# Patient Record
Sex: Female | Born: 1955 | Race: White | Hispanic: No | Smoking: Former smoker
Health system: Southern US, Community
[De-identification: ages and names within clinical notes are randomized; demographics above are authoritative.]

## PROBLEM LIST (undated history)

## (undated) DIAGNOSIS — I10 Essential (primary) hypertension: Secondary | ICD-10-CM

## (undated) DIAGNOSIS — E785 Hyperlipidemia, unspecified: Secondary | ICD-10-CM

## (undated) HISTORY — PX: ABDOMINAL HYSTERECTOMY: SHX81

---

## 2001-01-31 ENCOUNTER — Encounter: Admission: RE | Admit: 2001-01-31 | Discharge: 2001-01-31 | Payer: Self-pay | Admitting: Family Medicine

## 2001-01-31 ENCOUNTER — Encounter: Payer: Self-pay | Admitting: Family Medicine

## 2004-09-20 ENCOUNTER — Other Ambulatory Visit: Admission: RE | Admit: 2004-09-20 | Discharge: 2004-09-20 | Payer: Self-pay | Admitting: Family Medicine

## 2008-08-10 ENCOUNTER — Other Ambulatory Visit: Admission: RE | Admit: 2008-08-10 | Discharge: 2008-08-10 | Payer: Self-pay | Admitting: Family Medicine

## 2013-09-02 ENCOUNTER — Other Ambulatory Visit: Payer: Self-pay | Admitting: Family Medicine

## 2013-09-02 ENCOUNTER — Other Ambulatory Visit (HOSPITAL_COMMUNITY)
Admission: RE | Admit: 2013-09-02 | Discharge: 2013-09-02 | Disposition: A | Discharge status: Home / Self Care | Payer: BC Managed Care – PPO | Source: Ambulatory Visit | Attending: Family Medicine | Admitting: Family Medicine

## 2013-09-02 DIAGNOSIS — Z0000 Encounter for general adult medical examination without abnormal findings: Secondary | ICD-10-CM | POA: Insufficient documentation

## 2013-09-03 LAB — CYTOLOGY - PAP

## 2013-09-04 ENCOUNTER — Other Ambulatory Visit: Payer: Self-pay | Admitting: Family Medicine

## 2013-09-04 DIAGNOSIS — N852 Hypertrophy of uterus: Secondary | ICD-10-CM

## 2013-09-09 ENCOUNTER — Ambulatory Visit
Admission: RE | Admit: 2013-09-09 | Discharge: 2013-09-09 | Disposition: A | Discharge status: Home / Self Care | Payer: BC Managed Care – PPO | Source: Ambulatory Visit | Attending: Family Medicine | Admitting: Family Medicine

## 2013-09-09 ENCOUNTER — Ambulatory Visit
Admission: RE | Admit: 2013-09-09 | Discharge: 2013-09-09 | Disposition: A | Discharge status: Home / Self Care | Payer: Self-pay | Source: Ambulatory Visit | Attending: Family Medicine | Admitting: Family Medicine

## 2013-09-09 DIAGNOSIS — N852 Hypertrophy of uterus: Secondary | ICD-10-CM

## 2013-09-10 ENCOUNTER — Other Ambulatory Visit: Payer: Self-pay | Admitting: Obstetrics and Gynecology

## 2013-09-10 DIAGNOSIS — N838 Other noninflammatory disorders of ovary, fallopian tube and broad ligament: Secondary | ICD-10-CM

## 2013-09-11 ENCOUNTER — Other Ambulatory Visit: Payer: Self-pay

## 2013-09-11 DIAGNOSIS — Z1231 Encounter for screening mammogram for malignant neoplasm of breast: Secondary | ICD-10-CM

## 2013-09-14 ENCOUNTER — Ambulatory Visit
Admission: RE | Admit: 2013-09-14 | Discharge: 2013-09-14 | Disposition: A | Discharge status: Home / Self Care | Payer: BC Managed Care – PPO | Source: Ambulatory Visit | Attending: Obstetrics and Gynecology | Admitting: Obstetrics and Gynecology

## 2013-09-14 ENCOUNTER — Ambulatory Visit
Admission: RE | Admit: 2013-09-14 | Discharge: 2013-09-14 | Disposition: A | Discharge status: Home / Self Care | Payer: BC Managed Care – PPO | Source: Ambulatory Visit

## 2013-09-14 DIAGNOSIS — Z1231 Encounter for screening mammogram for malignant neoplasm of breast: Secondary | ICD-10-CM

## 2013-09-14 DIAGNOSIS — N838 Other noninflammatory disorders of ovary, fallopian tube and broad ligament: Secondary | ICD-10-CM

## 2013-09-14 MED ORDER — IOHEXOL 300 MG/ML  SOLN
100.0000 mL | Freq: Once | INTRAMUSCULAR | Status: AC | PRN
  Administered 2013-09-14: 100 mL via INTRAVENOUS

## 2015-04-12 IMAGING — US US PELVIS COMPLETE
1 series · 13 of 25 positions shown · non-contrast
Comparison: None

CLINICAL DATA: Uterine hypertrophy

EXAM:
TRANSABDOMINAL AND TRANSVAGINAL ULTRASOUND OF PELVIS
TECHNIQUE: Both transabdominal and transvaginal ultrasound examinations of the
pelvis were performed. Transabdominal technique was performed for
global imaging of the pelvis including uterus, ovaries, adnexal
regions, and pelvic cul-de-sac. It was necessary to proceed with
endovaginal exam following the transabdominal exam to visualize the
bilateral adnexa.

[Series 1: us pelvis complete · 0.25mm/px · 13 of 53 slices shown]
[im 1/53]
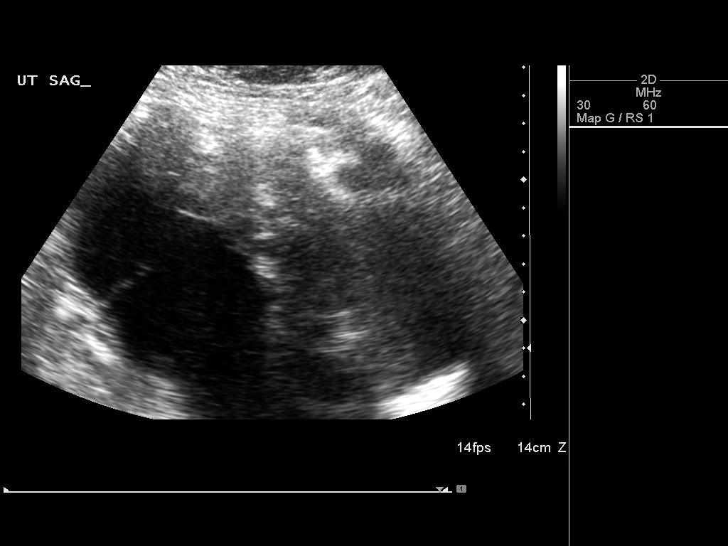
[im 5/53]
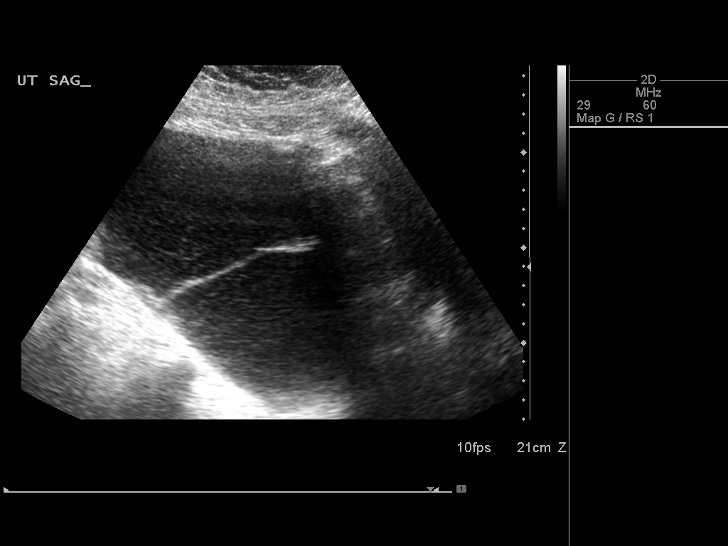
[im 9/53]
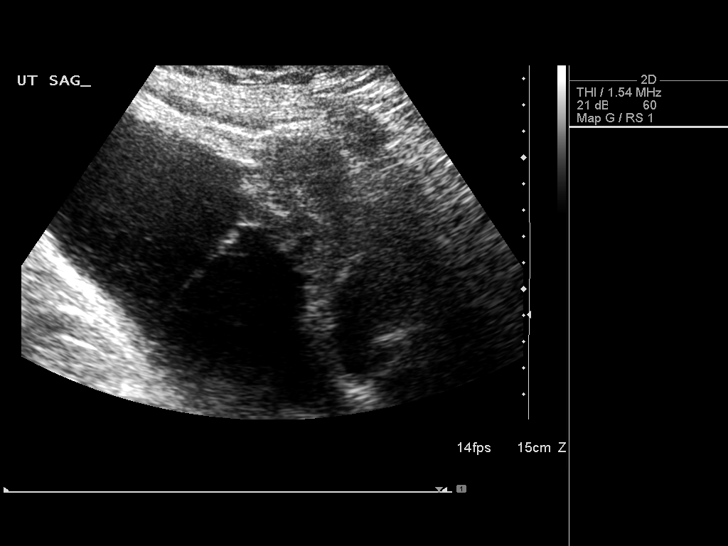
[im 14/53]
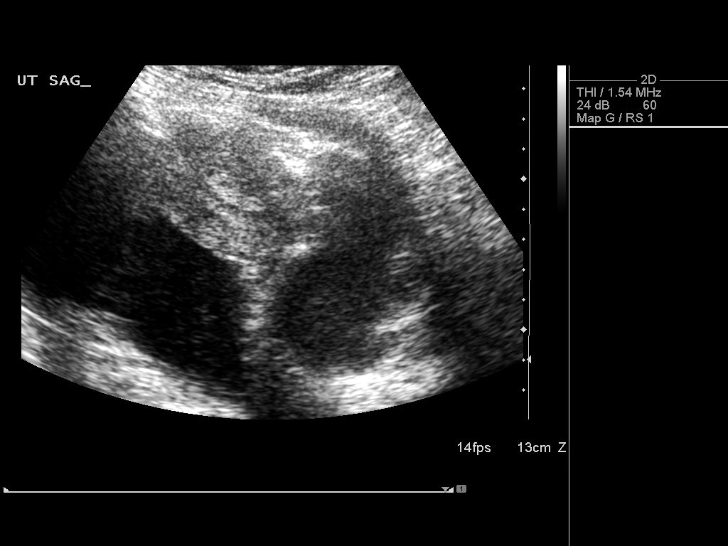
[im 18/53]
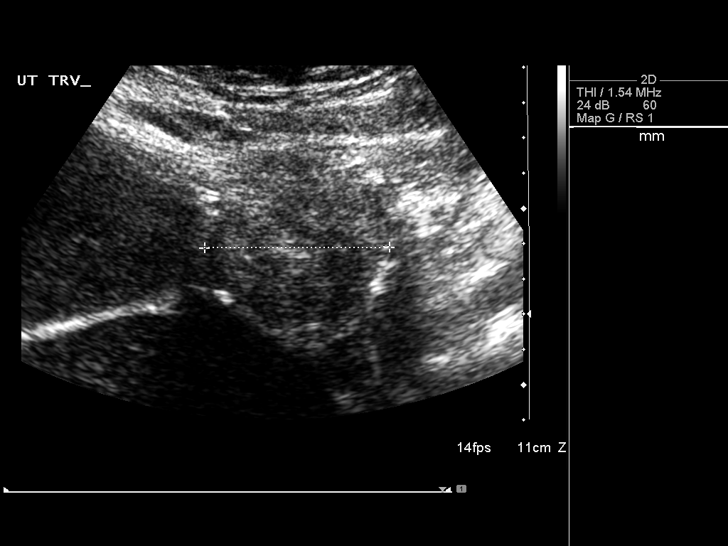
[im 22/53]
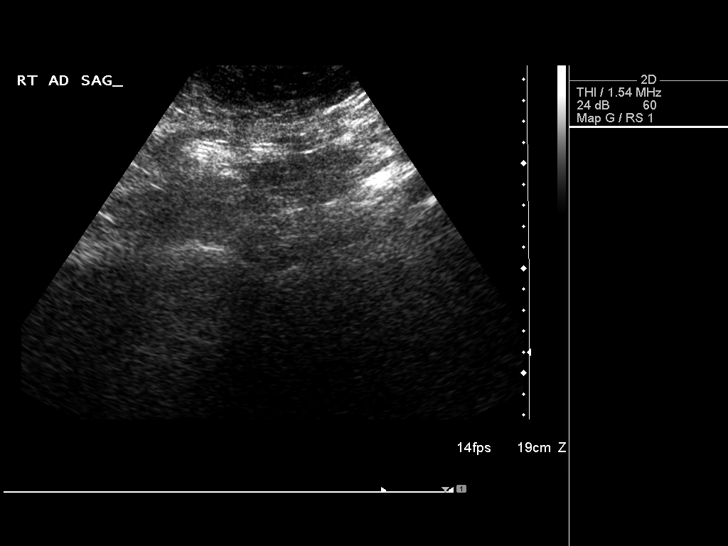
[im 27/53]
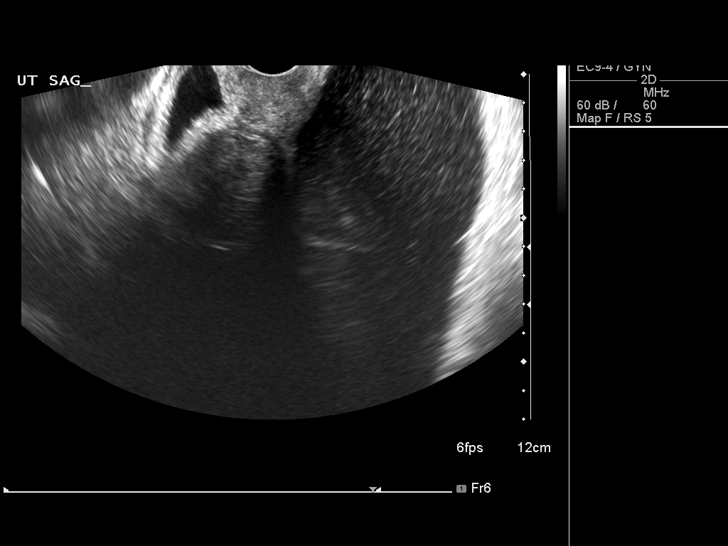
[im 31/53]
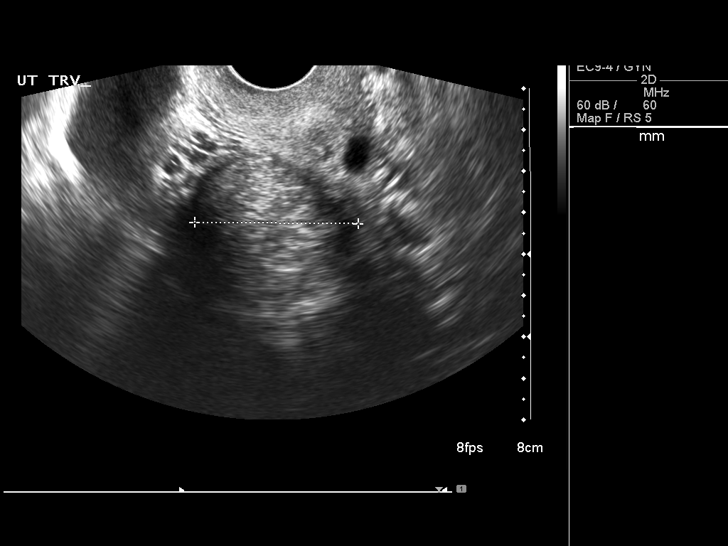
[im 35/53]
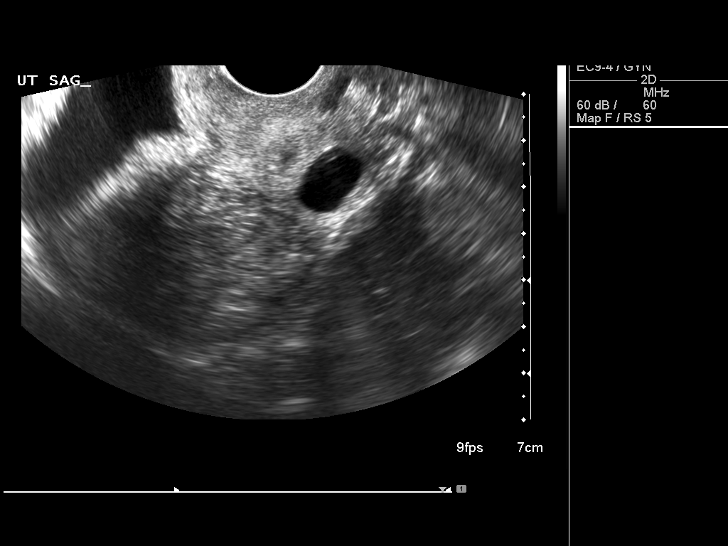
[im 40/53]
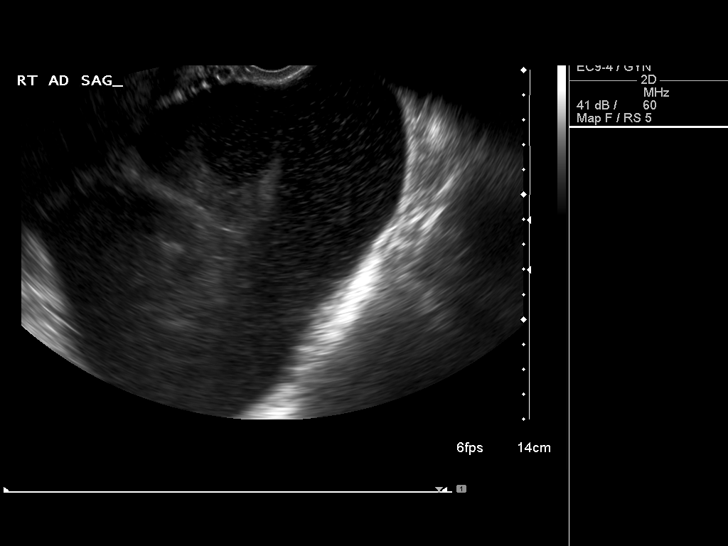
[im 44/53]
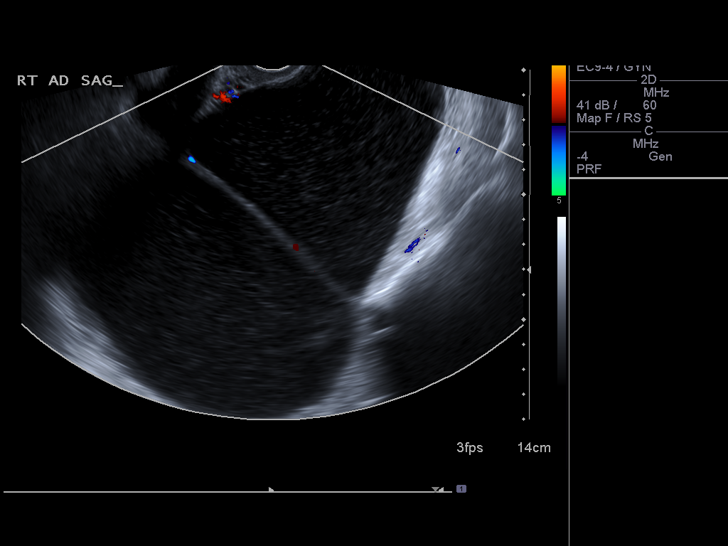
[im 48/53]
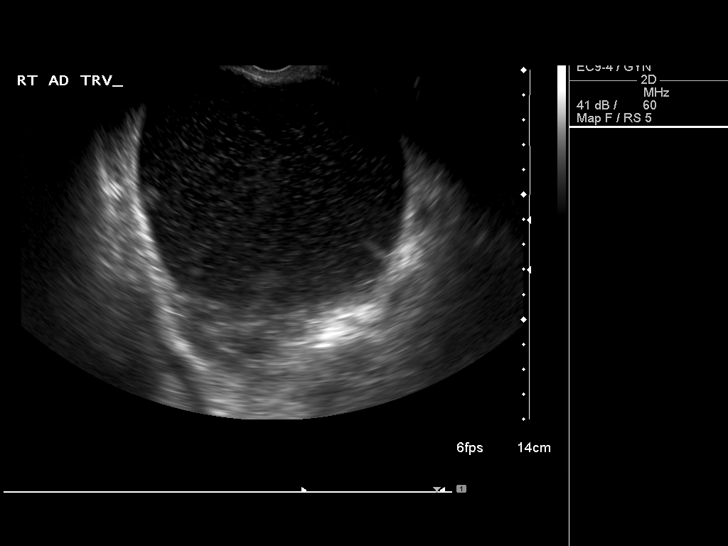
[im 53/53]
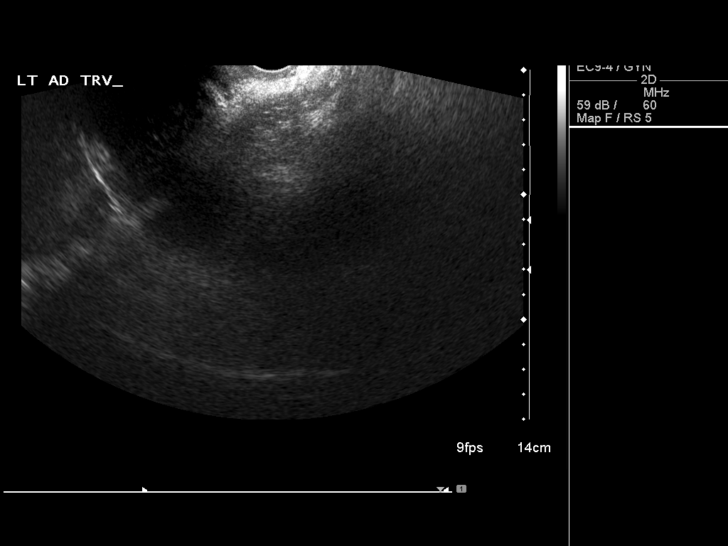

[13 of 25 positions shown; findings below may reference images not displayed]

FINDINGS: Uterus

Measurements: 10.6 x 4.3 x 5.2 cm. Dominant 3.9 x 4.4 x 4.0 cm
transmural posterior fundal fibroid.

Endometrium

Thickness: 5 mm.  No focal abnormality visualized.

Right ovary

Not discretely visualized. 16.4 x 12.4 x 12.7 cm complex/septated
right adnexal mass. Associated complex fluid/debris. Thickened
septation with possible color Doppler flow (image 14), worrisome for
malignancy.

Left ovary

Not discretely visualized.

Other findings

No free fluid.
IMPRESSION: 16.4 cm complex/septated right adnexal mass, with features worrisome
for malignancy, as above. OB-GYN/surgical consultation is suggested.
If further imaging characterization is desired, consider pelvic MRI
with/without contrast.

4.4 cm transmural posterior fundal fibroid.

## 2015-06-29 DIAGNOSIS — N183 Chronic kidney disease, stage 3 (moderate): Secondary | ICD-10-CM | POA: Diagnosis not present

## 2015-10-06 DIAGNOSIS — M9902 Segmental and somatic dysfunction of thoracic region: Secondary | ICD-10-CM | POA: Diagnosis not present

## 2015-10-06 DIAGNOSIS — M9904 Segmental and somatic dysfunction of sacral region: Secondary | ICD-10-CM | POA: Diagnosis not present

## 2015-10-06 DIAGNOSIS — M9903 Segmental and somatic dysfunction of lumbar region: Secondary | ICD-10-CM | POA: Diagnosis not present

## 2015-10-06 DIAGNOSIS — M9901 Segmental and somatic dysfunction of cervical region: Secondary | ICD-10-CM | POA: Diagnosis not present

## 2015-10-10 DIAGNOSIS — M9903 Segmental and somatic dysfunction of lumbar region: Secondary | ICD-10-CM | POA: Diagnosis not present

## 2015-10-10 DIAGNOSIS — M9901 Segmental and somatic dysfunction of cervical region: Secondary | ICD-10-CM | POA: Diagnosis not present

## 2015-10-10 DIAGNOSIS — M9904 Segmental and somatic dysfunction of sacral region: Secondary | ICD-10-CM | POA: Diagnosis not present

## 2015-10-10 DIAGNOSIS — M9902 Segmental and somatic dysfunction of thoracic region: Secondary | ICD-10-CM | POA: Diagnosis not present

## 2015-11-03 DIAGNOSIS — M9901 Segmental and somatic dysfunction of cervical region: Secondary | ICD-10-CM | POA: Diagnosis not present

## 2015-11-03 DIAGNOSIS — M9902 Segmental and somatic dysfunction of thoracic region: Secondary | ICD-10-CM | POA: Diagnosis not present

## 2015-11-03 DIAGNOSIS — M9903 Segmental and somatic dysfunction of lumbar region: Secondary | ICD-10-CM | POA: Diagnosis not present

## 2015-11-03 DIAGNOSIS — M9904 Segmental and somatic dysfunction of sacral region: Secondary | ICD-10-CM | POA: Diagnosis not present

## 2015-11-14 DIAGNOSIS — E782 Mixed hyperlipidemia: Secondary | ICD-10-CM | POA: Diagnosis not present

## 2015-11-14 DIAGNOSIS — Z0000 Encounter for general adult medical examination without abnormal findings: Secondary | ICD-10-CM | POA: Diagnosis not present

## 2015-11-14 DIAGNOSIS — I129 Hypertensive chronic kidney disease with stage 1 through stage 4 chronic kidney disease, or unspecified chronic kidney disease: Secondary | ICD-10-CM | POA: Diagnosis not present

## 2015-11-14 DIAGNOSIS — K219 Gastro-esophageal reflux disease without esophagitis: Secondary | ICD-10-CM | POA: Diagnosis not present

## 2015-11-14 DIAGNOSIS — N183 Chronic kidney disease, stage 3 (moderate): Secondary | ICD-10-CM | POA: Diagnosis not present

## 2016-01-19 DIAGNOSIS — E782 Mixed hyperlipidemia: Secondary | ICD-10-CM | POA: Diagnosis not present

## 2016-03-28 DIAGNOSIS — D1801 Hemangioma of skin and subcutaneous tissue: Secondary | ICD-10-CM | POA: Diagnosis not present

## 2016-03-28 DIAGNOSIS — L570 Actinic keratosis: Secondary | ICD-10-CM | POA: Diagnosis not present

## 2016-03-28 DIAGNOSIS — D2239 Melanocytic nevi of other parts of face: Secondary | ICD-10-CM | POA: Diagnosis not present

## 2016-03-28 DIAGNOSIS — L820 Inflamed seborrheic keratosis: Secondary | ICD-10-CM | POA: Diagnosis not present

## 2016-03-28 DIAGNOSIS — L814 Other melanin hyperpigmentation: Secondary | ICD-10-CM | POA: Diagnosis not present

## 2016-03-28 DIAGNOSIS — L821 Other seborrheic keratosis: Secondary | ICD-10-CM | POA: Diagnosis not present

## 2016-06-05 ENCOUNTER — Inpatient Hospital Stay (HOSPITAL_COMMUNITY)
Admission: EM | Admit: 2016-06-05 | Discharge: 2016-06-07 | DRG: 419 | Disposition: A | Discharge status: Home / Self Care | Payer: BLUE CROSS/BLUE SHIELD | Attending: General Surgery | Admitting: General Surgery

## 2016-06-05 ENCOUNTER — Emergency Department (HOSPITAL_COMMUNITY): Payer: BLUE CROSS/BLUE SHIELD

## 2016-06-05 ENCOUNTER — Encounter (HOSPITAL_COMMUNITY): Payer: Self-pay | Admitting: Emergency Medicine

## 2016-06-05 DIAGNOSIS — R109 Unspecified abdominal pain: Secondary | ICD-10-CM | POA: Diagnosis not present

## 2016-06-05 DIAGNOSIS — Z79899 Other long term (current) drug therapy: Secondary | ICD-10-CM | POA: Diagnosis not present

## 2016-06-05 DIAGNOSIS — K810 Acute cholecystitis: Secondary | ICD-10-CM | POA: Diagnosis not present

## 2016-06-05 DIAGNOSIS — K819 Cholecystitis, unspecified: Secondary | ICD-10-CM

## 2016-06-05 DIAGNOSIS — K8012 Calculus of gallbladder with acute and chronic cholecystitis without obstruction: Secondary | ICD-10-CM | POA: Diagnosis not present

## 2016-06-05 DIAGNOSIS — K8010 Calculus of gallbladder with chronic cholecystitis without obstruction: Secondary | ICD-10-CM | POA: Diagnosis not present

## 2016-06-05 DIAGNOSIS — K8000 Calculus of gallbladder with acute cholecystitis without obstruction: Principal | ICD-10-CM | POA: Diagnosis present

## 2016-06-05 DIAGNOSIS — Z87891 Personal history of nicotine dependence: Secondary | ICD-10-CM | POA: Diagnosis not present

## 2016-06-05 DIAGNOSIS — I10 Essential (primary) hypertension: Secondary | ICD-10-CM | POA: Diagnosis present

## 2016-06-05 DIAGNOSIS — R1013 Epigastric pain: Secondary | ICD-10-CM | POA: Diagnosis not present

## 2016-06-05 DIAGNOSIS — Z419 Encounter for procedure for purposes other than remedying health state, unspecified: Secondary | ICD-10-CM

## 2016-06-05 DIAGNOSIS — K8020 Calculus of gallbladder without cholecystitis without obstruction: Secondary | ICD-10-CM | POA: Diagnosis not present

## 2016-06-05 DIAGNOSIS — R1110 Vomiting, unspecified: Secondary | ICD-10-CM | POA: Diagnosis not present

## 2016-06-05 HISTORY — DX: Hyperlipidemia, unspecified: E78.5

## 2016-06-05 HISTORY — DX: Essential (primary) hypertension: I10

## 2016-06-05 LAB — COMPREHENSIVE METABOLIC PANEL
ALK PHOS: 59 U/L (ref 38–126)
ALT: 21 U/L (ref 14–54)
ANION GAP: 9 (ref 5–15)
AST: 27 U/L (ref 15–41)
Albumin: 3.8 g/dL (ref 3.5–5.0)
BILIRUBIN TOTAL: 0.6 mg/dL (ref 0.3–1.2)
BUN: 22 mg/dL — ABNORMAL HIGH (ref 6–20)
CALCIUM: 9.5 mg/dL (ref 8.9–10.3)
CO2: 27 mmol/L (ref 22–32)
Chloride: 102 mmol/L (ref 101–111)
Creatinine, Ser: 1.04 mg/dL — ABNORMAL HIGH (ref 0.44–1.00)
GFR calc non Af Amer: 56 mL/min — ABNORMAL LOW (ref >60)
Glucose, Bld: 126 mg/dL — ABNORMAL HIGH (ref 65–99)
Potassium: 3.8 mmol/L (ref 3.5–5.1)
SODIUM: 138 mmol/L (ref 135–145)
TOTAL PROTEIN: 7.4 g/dL (ref 6.5–8.1)

## 2016-06-05 LAB — CBC
HCT: 37.3 % (ref 36.0–46.0)
HEMOGLOBIN: 12.6 g/dL (ref 12.0–15.0)
MCH: 29.4 pg (ref 26.0–34.0)
MCHC: 33.8 g/dL (ref 30.0–36.0)
MCV: 87.1 fL (ref 78.0–100.0)
Platelets: 351 10*3/uL (ref 150–400)
RBC: 4.28 MIL/uL (ref 3.87–5.11)
RDW: 14 % (ref 11.5–15.5)
WBC: 17.4 10*3/uL — AB (ref 4.0–10.5)

## 2016-06-05 LAB — URINALYSIS, ROUTINE W REFLEX MICROSCOPIC
Bilirubin Urine: NEGATIVE
Glucose, UA: NEGATIVE mg/dL
Hgb urine dipstick: NEGATIVE
Ketones, ur: NEGATIVE mg/dL
Leukocytes, UA: NEGATIVE
NITRITE: NEGATIVE
Protein, ur: NEGATIVE mg/dL
Specific Gravity, Urine: >1.046 — ABNORMAL HIGH (ref 1.005–1.030)
pH: 6 (ref 5.0–8.0)

## 2016-06-05 LAB — LIPASE, BLOOD: Lipase: 29 U/L (ref 11–51)

## 2016-06-05 LAB — TROPONIN I

## 2016-06-05 MED ORDER — FENTANYL CITRATE (PF) 100 MCG/2ML IJ SOLN
100.0000 ug | Freq: Once | INTRAMUSCULAR | Status: AC
  Administered 2016-06-05: 100 ug via INTRAVENOUS
  Filled 2016-06-05: qty 2

## 2016-06-05 MED ORDER — SODIUM CHLORIDE 0.9 % IV BOLUS (SEPSIS)
1000.0000 mL | Freq: Once | INTRAVENOUS | Status: AC
  Administered 2016-06-05: 1000 mL via INTRAVENOUS

## 2016-06-05 MED ORDER — DEXTROSE 5 % IV SOLN
2.0000 g | Freq: Once | INTRAVENOUS | Status: AC
  Administered 2016-06-05: 2 g via INTRAVENOUS
  Filled 2016-06-05: qty 2

## 2016-06-05 MED ORDER — ONDANSETRON HCL 4 MG/2ML IJ SOLN
4.0000 mg | Freq: Four times a day (QID) | INTRAMUSCULAR | Status: DC | PRN
  Administered 2016-06-05: 4 mg via INTRAVENOUS
  Filled 2016-06-05: qty 2

## 2016-06-05 MED ORDER — MORPHINE SULFATE (PF) 4 MG/ML IV SOLN
2.0000 mg | INTRAVENOUS | Status: DC | PRN
  Administered 2016-06-05: 6 mg via INTRAVENOUS
  Administered 2016-06-06: 4 mg via INTRAVENOUS
  Administered 2016-06-06 (×2): 2 mg via INTRAVENOUS
  Filled 2016-06-05: qty 2
  Filled 2016-06-05 (×3): qty 1

## 2016-06-05 MED ORDER — FENTANYL CITRATE (PF) 100 MCG/2ML IJ SOLN
50.0000 ug | Freq: Once | INTRAMUSCULAR | Status: AC
Start: 2016-06-05 — End: 2016-06-05
  Administered 2016-06-05: 50 ug via INTRAVENOUS
  Filled 2016-06-05: qty 2

## 2016-06-05 MED ORDER — ONDANSETRON 4 MG PO TBDP: 4.0000 mg | ORAL_TABLET | Freq: Four times a day (QID) | ORAL | Status: DC | PRN

## 2016-06-05 MED ORDER — ONDANSETRON 4 MG PO TBDP
4.0000 mg | ORAL_TABLET | Freq: Once | ORAL | Status: DC | PRN
Start: 2016-06-05 — End: 2016-06-06

## 2016-06-05 MED ORDER — ONDANSETRON HCL 4 MG/2ML IJ SOLN
4.0000 mg | Freq: Once | INTRAMUSCULAR | Status: AC
  Administered 2016-06-05: 4 mg via INTRAVENOUS
  Filled 2016-06-05: qty 2

## 2016-06-05 MED ORDER — IOPAMIDOL (ISOVUE-300) INJECTION 61%
INTRAVENOUS | Status: AC
  Filled 2016-06-05: qty 100

## 2016-06-05 MED ORDER — IOPAMIDOL (ISOVUE-300) INJECTION 61%
100.0000 mL | Freq: Once | INTRAVENOUS | Status: AC | PRN
  Administered 2016-06-05: 100 mL via INTRAVENOUS

## 2016-06-05 NOTE — ED Notes (Signed)
Pt does not want to be bothered with vitals she states she "wants to sleep"

## 2016-06-05 NOTE — H&P (Signed)
Helen Hughes is an 61 y.o. female.    Chief Complaint: abdominal pain  HPI: Patient is a very pleasant 61 year old female who 2 days ago had the onset of epigastric abdominal pain. She has some chronic GERD and initially thought it might be related. However the pain quickly became more severe. She describes a pressure-like pain in her epigastrium that radiates around both rib cages to her back. This has been associated with nausea and vomiting. The pain has been constant since its onset. It is worse with deep breaths. She denies fever or chills. No jaundice. No urinary symptoms. Bowel movements have been normal. She doesn't have history of similar pain but does have again occasional reflux. She presented to the emergency department this evening.  Past Medical History:  Diagnosis Date  . Hyperlipidemia   . Hypertension     Past Surgical History:  Procedure Laterality Date  . ABDOMINAL HYSTERECTOMY      History reviewed. No pertinent family history. Social History:  reports that she quit smoking about 39 years ago. Her smoking use included Cigarettes. She has never used smokeless tobacco. She reports that she drinks alcohol. She reports that she has current or past drug history, including Marijuana and Cocaine.  Allergies: No Known Allergies  Current Facility-Administered Medications  Medication Dose Route Frequency Provider Last Rate Last Dose  . ondansetron (ZOFRAN-ODT) disintegrating tablet 4 mg  4 mg Oral Once PRN Noemi Chapel, MD       Current Outpatient Prescriptions  Medication Sig Dispense Refill  . b complex vitamins capsule Take 1 capsule by mouth daily.    . cholecalciferol (VITAMIN D) 1000 units tablet Take 1,000 Units by mouth daily.    Marland Kitchen losartan-hydrochlorothiazide (HYZAAR) 100-25 MG tablet Take 1 tablet by mouth every morning.  1  . magnesium 30 MG tablet Take 30 mg by mouth daily.    . Red Yeast Rice Extract (RED YEAST RICE PO) Take 1 capsule by mouth 2 (two) times  daily.       Results for orders placed or performed during the hospital encounter of 06/05/16 (from the past 48 hour(s))  Urinalysis, Routine w reflex microscopic     Status: Abnormal   Collection Time: 06/05/16  2:05 PM  Result Value Ref Range   Color, Urine YELLOW YELLOW   APPearance CLEAR CLEAR   Specific Gravity, Urine >1.046 (H) 1.005 - 1.030   pH 6.0 5.0 - 8.0   Glucose, UA NEGATIVE NEGATIVE mg/dL   Hgb urine dipstick NEGATIVE NEGATIVE   Bilirubin Urine NEGATIVE NEGATIVE   Ketones, ur NEGATIVE NEGATIVE mg/dL   Protein, ur NEGATIVE NEGATIVE mg/dL   Nitrite NEGATIVE NEGATIVE   Leukocytes, UA NEGATIVE NEGATIVE  Lipase, blood     Status: None   Collection Time: 06/05/16  2:17 PM  Result Value Ref Range   Lipase 29 11 - 51 U/L  Comprehensive metabolic panel     Status: Abnormal   Collection Time: 06/05/16  2:17 PM  Result Value Ref Range   Sodium 138 135 - 145 mmol/L   Potassium 3.8 3.5 - 5.1 mmol/L   Chloride 102 101 - 111 mmol/L   CO2 27 22 - 32 mmol/L   Glucose, Bld 126 (H) 65 - 99 mg/dL   BUN 22 (H) 6 - 20 mg/dL   Creatinine, Ser 1.04 (H) 0.44 - 1.00 mg/dL   Calcium 9.5 8.9 - 10.3 mg/dL   Total Protein 7.4 6.5 - 8.1 g/dL   Albumin 3.8 3.5 -  5.0 g/dL   AST 27 15 - 41 U/L   ALT 21 14 - 54 U/L   Alkaline Phosphatase 59 38 - 126 U/L   Total Bilirubin 0.6 0.3 - 1.2 mg/dL   GFR calc non Af Amer 56 (L) >60 mL/min   GFR calc Af Amer >60 >60 mL/min    Comment: (NOTE) The eGFR has been calculated using the CKD EPI equation. This calculation has not been validated in all clinical situations. eGFR's persistently <60 mL/min signify possible Chronic Kidney Disease.    Anion gap 9 5 - 15  CBC     Status: Abnormal   Collection Time: 06/05/16  2:17 PM  Result Value Ref Range   WBC 17.4 (H) 4.0 - 10.5 K/uL   RBC 4.28 3.87 - 5.11 MIL/uL   Hemoglobin 12.6 12.0 - 15.0 g/dL   HCT 37.3 36.0 - 46.0 %   MCV 87.1 78.0 - 100.0 fL   MCH 29.4 26.0 - 34.0 pg   MCHC 33.8 30.0 - 36.0  g/dL   RDW 14.0 11.5 - 15.5 %   Platelets 351 150 - 400 K/uL  Troponin I     Status: None   Collection Time: 06/05/16  4:30 PM  Result Value Ref Range   Troponin I <0.03 <0.03 ng/mL   Ct Abdomen Pelvis W Contrast  Result Date: 06/05/2016 CLINICAL DATA:  61 year old female with abdominal pain and vomiting since last night. Initial encounter. EXAM: CT ABDOMEN AND PELVIS WITH CONTRAST TECHNIQUE: Multidetector CT imaging of the abdomen and pelvis was performed using the standard protocol following bolus administration of intravenous contrast. CONTRAST:  161m ISOVUE-300 IOPAMIDOL (ISOVUE-300) INJECTION 61% COMPARISON:  None. FINDINGS: Lower chest: Minimal atelectasis/ scarring lung bases. Heart size within normal limits. Hepatobiliary: Liver enlarged spanning over 20 cm with fatty infiltration without focal worrisome mass. Abnormal appearance of the gallbladder suggestive of cholecystitis. Pericholecystic fluid versus gallbladder wall thickening. Gallstones may be present but are not calcified. Pancreas: No pancreatic mass, inflammation or pancreatic duct dilation. Spleen: No splenic lesion or enlargement. Adrenals/Urinary Tract: No adrenal lesion. No renal or ureteral obstructing stone or evidence hydronephrosis bilateral renal low-density structures, larger ones can be confirmed as cysts whereas others are too small to adequately characterize. There is a 6.8 mm lesion within the superior medial aspect of the right kidney which has slightly thickened borders. Dedicated contrast enhanced renal MR recommended for further delineation. Noncontrast filled views of the urinary bladder unremarkable. Stomach/Bowel: Small hiatal hernia. Partially decompressed gastric fundus without gross abnormality. Portions of the colon are underdistended limiting evaluation however, no extraluminal bowel inflammatory process detected. The small amount of free fluid in the pelvis is probably related to the cholecystitis. Appendix  not clearly delineated. Vascular/Lymphatic: Atherosclerotic changes abdominal aorta without aneurysmal dilation or major vessel occlusion. No adenopathy. Reproductive: Post hysterectomy. Previously noted pelvic cystic lesion may have been surgically resected. Other: Trace amount of free fluid in the pelvis felt to be related to cholecystitis. Musculoskeletal: Degenerative changes thoracic and lumbar spine including gas containing left paracentral L3-4 cephalad extending disc protrusion. IMPRESSION: Abnormal appearance of the gallbladder suggestive of cholecystitis. Pericholecystic fluid versus gallbladder wall thickening. Gallstones may be present but are not calcified. Trace amount of free fluid in the pelvis felt to be related to cholecystitis. Liver enlarged spanning over 20 cm with fatty infiltration. 6.8 mm lesion within the superior medial aspect of the right kidney which has slightly thickened borders and slightly hyperdense. Dedicated contrast enhanced renal MR recommended for  further delineation. Post hysterectomy. Previously noted pelvic cystic lesion may have been surgically resected. No primary bowel inflammatory process.  Appendix not visualized. Degenerative changes thoracic and lumbar spine including gas containing left paracentral L3-4 cephalad extending disc protrusion. These results were called by telephone at the time of interpretation on 06/05/2016 at 4:56 pm to Will Dansie, ER PA, who verbally acknowledged these results. Electronically Signed   By: Genia Del M.D.   On: 06/05/2016 17:20    Review of Systems  Constitutional: Negative for chills, fever and weight loss.  Respiratory: Negative.   Cardiovascular: Negative.   Gastrointestinal: Positive for abdominal pain, heartburn, nausea and vomiting. Negative for blood in stool, constipation and diarrhea.  Genitourinary: Negative.     Blood pressure 152/76, pulse 102, temperature 98.1 F (36.7 C), temperature source Oral, resp. rate  14, SpO2 100 %. Physical Exam  General: Alert, mildly overweight Caucasian female, in no distress Skin: Warm and dry without rash or infection. HEENT: No palpable masses or thyromegaly. Sclera nonicteric. Pupils equal round and reactive. Lymph nodes: No cervical, supraclavicular,  nodes palpable. Lungs: Breath sounds clear and equal without increased work of breathing Cardiovascular: Regular rate and rhythm without murmur. No JVD or edema. Abdomen: Nondistended. Moderate tenderness in the epigastrium with slight guarding. No masses palpable. No organomegaly. No palpable hernias. Extremities: No edema or joint swelling or deformity. No chronic venous stasis changes. Neurologic: Alert and fully oriented. No gross motor deficits.   Assessment/Plan Acute epigastric abdominal pain of 48 hours duration. CT scan suggestive of acute cholecystitis although no stones seen. She likely has acute cholecystitis. Peptic ulcer disease or severe reflux seems much less likely. Patient will be admitted and started on IV antibiotics, pain and nausea medication. Obtain gallbladder ultrasound. Likely will require laparoscopic cholecystectomy. Discussed with the patient and all her questions answered.  Edward Jolly, MD 06/05/2016, 7:29 PM

## 2016-06-05 NOTE — ED Notes (Signed)
Pt made aware of need for urine specimen 

## 2016-06-05 NOTE — ED Provider Notes (Signed)
[facility] DEPT Provider Note   CSN: [id number] Arrival date & time: 06/05/16  1341     History   Chief Complaint Chief Complaint  Patient presents with  . Abdominal Pain    HPI Helen Hughes is a 61 y.o. female.   Abdominal Pain   Associated symptoms include nausea and vomiting. Pertinent negatives include fever, diarrhea, dysuria, frequency and headaches.    The patient is a 61 year old female, she does have a prior history of a hysterectomy performed a couple of years ago and states that she had her appendix out at the same time. She denies any prior history of gallbladder disease, pancreatitis or peptic ulcer disease but states that last night she started to have some epigastric pain, nausea and vomiting. This has been persistent throughout the evening and states that she has vomited more than 20 times in the last 12 hours. She denies any changes in her bowel habits including no blood in the stools, no diarrhea and has had normal stools as well as normal gas today. She states that her abdomen feels distended and bloated. She has been unable to eat or drink because of that. When she called her doctor's office this morning she was told that this could potentially be related to her heart, her pancreas, her gallbladder, or something else and was referred to the emergency department. The patient has never had any of these problems in the past. At this time her symptoms are moderate to severe. She states that the pain does radiate to her back.  Past Medical History:  Diagnosis Date  . Hyperlipidemia   . Hypertension     There are no active problems to display for this patient.   Past Surgical History:  Procedure Laterality Date  . ABDOMINAL HYSTERECTOMY      OB History    No data available       Home Medications    Prior to Admission medications   Medication Sig Start Date End Date Taking? Authorizing Provider  b complex vitamins capsule Take 1 capsule by mouth  daily.   Yes Historical Provider, MD  cholecalciferol (VITAMIN D) 1000 units tablet Take 1,000 Units by mouth daily.   Yes Historical Provider, MD  losartan-hydrochlorothiazide (HYZAAR) 100-25 MG tablet Take 1 tablet by mouth every morning. 03/07/16  Yes Historical Provider, MD  magnesium 30 MG tablet Take 30 mg by mouth daily.   Yes Historical Provider, MD  Red Yeast Rice Extract (RED YEAST RICE PO) Take 1 capsule by mouth 2 (two) times daily.   Yes Historical Provider, MD    Family History History reviewed. No pertinent family history.  Social History Social History  Substance Use Topics  . Smoking status: Former Smoker    Types: Cigarettes    Quit date: 06/05/1977  . Smokeless tobacco: Never Used  . Alcohol use Yes     Comment: occasional wine     Allergies   Patient has no known allergies.   Review of Systems Review of Systems  Constitutional: Negative for chills and fever.  HENT: Negative for sore throat.   Eyes: Negative for visual disturbance.  Respiratory: Negative for cough and shortness of breath.   Cardiovascular: Negative for chest pain.  Gastrointestinal: Positive for abdominal pain, nausea and vomiting. Negative for diarrhea.  Genitourinary: Negative for dysuria and frequency.  Musculoskeletal: Negative for back pain and neck pain.  Skin: Negative for rash.  Neurological: Negative for weakness, numbness and headaches.  Hematological: Negative for adenopathy.  Psychiatric/Behavioral: Negative for behavioral problems.     Physical Exam Updated Vital Signs BP 152/76 (BP Location: Left Arm)   Pulse 102   Temp 98.1 F (36.7 C) (Oral)   Resp 14   SpO2 100%   Physical Exam  Constitutional: She appears well-developed and well-nourished. No distress.  HENT:  Head: Normocephalic and atraumatic.  Mouth/Throat: Oropharynx is clear and moist. No oropharyngeal exudate.  Eyes: Conjunctivae and EOM are normal. Pupils are equal, round, and reactive to light. Right  eye exhibits no discharge. Left eye exhibits no discharge. No scleral icterus.  No jaundice  Neck: Normal range of motion. Neck supple. No JVD present. No thyromegaly present.  Cardiovascular: Regular rhythm, normal heart sounds and intact distal pulses.  Exam reveals no gallop and no friction rub.   No murmur heard. Mild tachycardia but normal pulses at the radial arteries bilaterally  Pulmonary/Chest: Effort normal and breath sounds normal. No respiratory distress. She has no wheezes. She has no rales. She exhibits tenderness.  Mild tenderness over the mid chest wall on the lower sternum  Abdominal: Soft. Bowel sounds are normal. She exhibits no distension and no mass. There is tenderness.  There is no tympanitic sounds to percussion, bowel sounds are normal, tenderness in the right upper, left upper and epigastric abdomen as well as the left lower quadrant. Mild guarding in the upper abdomen.  Musculoskeletal: Normal range of motion. She exhibits no edema or tenderness.  Lymphadenopathy:    She has no cervical adenopathy.  Neurological: She is alert. Coordination normal.  Skin: Skin is warm and dry. No rash noted. No erythema.  Psychiatric: She has a normal mood and affect. Her behavior is normal.  Nursing note and vitals reviewed.    ED Treatments / Results  Labs (all labs ordered are listed, but only abnormal results are displayed) Labs Reviewed  COMPREHENSIVE METABOLIC PANEL - Abnormal; Notable for the following:       Result Value   Glucose, Bld 126 (*)    BUN 22 (*)    Creatinine, Ser 1.04 (*)    GFR calc non Af Amer 56 (*)    All other components within normal limits  CBC - Abnormal; Notable for the following:    WBC 17.4 (*)    All other components within normal limits  LIPASE, BLOOD  TROPONIN I  URINALYSIS, ROUTINE W REFLEX MICROSCOPIC    Radiology Ct Abdomen Pelvis W Contrast  Result Date: 06/05/2016 CLINICAL DATA:  61 year old female with abdominal pain and  vomiting since last night. Initial encounter. EXAM: CT ABDOMEN AND PELVIS WITH CONTRAST TECHNIQUE: Multidetector CT imaging of the abdomen and pelvis was performed using the standard protocol following bolus administration of intravenous contrast. CONTRAST:  161mL ISOVUE-300 IOPAMIDOL (ISOVUE-300) INJECTION 61% COMPARISON:  None. FINDINGS: Lower chest: Minimal atelectasis/ scarring lung bases. Heart size within normal limits. Hepatobiliary: Liver enlarged spanning over 20 cm with fatty infiltration without focal worrisome mass. Abnormal appearance of the gallbladder suggestive of cholecystitis. Pericholecystic fluid versus gallbladder wall thickening. Gallstones may be present but are not calcified. Pancreas: No pancreatic mass, inflammation or pancreatic duct dilation. Spleen: No splenic lesion or enlargement. Adrenals/Urinary Tract: No adrenal lesion. No renal or ureteral obstructing stone or evidence hydronephrosis bilateral renal low-density structures, larger ones can be confirmed as cysts whereas others are too small to adequately characterize. There is a 6.8 mm lesion within the superior medial aspect of the right kidney which has slightly thickened borders. Dedicated contrast enhanced renal MR  recommended for further delineation. Noncontrast filled views of the urinary bladder unremarkable. Stomach/Bowel: Small hiatal hernia. Partially decompressed gastric fundus without gross abnormality. Portions of the colon are underdistended limiting evaluation however, no extraluminal bowel inflammatory process detected. The small amount of free fluid in the pelvis is probably related to the cholecystitis. Appendix not clearly delineated. Vascular/Lymphatic: Atherosclerotic changes abdominal aorta without aneurysmal dilation or major vessel occlusion. No adenopathy. Reproductive: Post hysterectomy. Previously noted pelvic cystic lesion may have been surgically resected. Other: Trace amount of free fluid in the pelvis  felt to be related to cholecystitis. Musculoskeletal: Degenerative changes thoracic and lumbar spine including gas containing left paracentral L3-4 cephalad extending disc protrusion. IMPRESSION: Abnormal appearance of the gallbladder suggestive of cholecystitis. Pericholecystic fluid versus gallbladder wall thickening. Gallstones may be present but are not calcified. Trace amount of free fluid in the pelvis felt to be related to cholecystitis. Liver enlarged spanning over 20 cm with fatty infiltration. 6.8 mm lesion within the superior medial aspect of the right kidney which has slightly thickened borders and slightly hyperdense. Dedicated contrast enhanced renal MR recommended for further delineation. Post hysterectomy. Previously noted pelvic cystic lesion may have been surgically resected. No primary bowel inflammatory process.  Appendix not visualized. Degenerative changes thoracic and lumbar spine including gas containing left paracentral L3-4 cephalad extending disc protrusion. These results were called by telephone at the time of interpretation on 06/05/2016 at 4:56 pm to Will Dansie, ER PA, who verbally acknowledged these results. Electronically Signed   By: Genia Del M.D.   On: 06/05/2016 17:20    Procedures Procedures (including critical care time)  Medications Ordered in ED Medications  ondansetron (ZOFRAN-ODT) disintegrating tablet 4 mg (not administered)  iopamidol (ISOVUE-300) 61 % injection (not administered)  cefTRIAXone (ROCEPHIN) 2 g in dextrose 5 % 50 mL IVPB (not administered)  sodium chloride 0.9 % bolus 1,000 mL (1,000 mLs Intravenous New Bag/Given 06/05/16 1635)  ondansetron (ZOFRAN) injection 4 mg (4 mg Intravenous Given 06/05/16 1635)  fentaNYL (SUBLIMAZE) injection 100 mcg (100 mcg Intravenous Given 06/05/16 1635)  iopamidol (ISOVUE-300) 61 % injection 100 mL (100 mLs Intravenous Contrast Given 06/05/16 1644)     Initial Impression / Assessment and Plan / ED Course  I  have reviewed the triage vital signs and the nursing notes.  Pertinent labs & imaging results that were available during my care of the patient were reviewed by me and considered in medical decision making (see chart for details).     The patient has concerns for multiple abdominal etiologies as well as her heart, this does not appear to be cardiac in nature. She has very reproducible pain with persistent nausea and vomiting, I will obtain labs including CBC, CMP, lipase as well as a troponin for the patient's benefit. We'll also obtain a CT scan to further evaluate the source of these symptoms as the patient does not have a focal right upper quadrant tenderness. The patient is in agreement with the plan. She has been given IV fluids, Zofran and fentanyl.  Labs reviewed and show leukocytosis of over 17,000, normal lipase, normal comprehensive metabolic panel as well as renal function, liver function.  D/w Dr. Excell Seltzer - will see when done in the OR.  Final Clinical Impressions(s) / ED Diagnoses   Final diagnoses:  Cholecystitis    New Prescriptions New Prescriptions   No medications on file     Noemi Chapel, MD 06/05/16 1816

## 2016-06-05 NOTE — ED Triage Notes (Signed)
Per pt, states abdominal pain and vomiting since last night-PCP told her to come to ED for eval

## 2016-06-06 ENCOUNTER — Inpatient Hospital Stay (HOSPITAL_COMMUNITY): Payer: BLUE CROSS/BLUE SHIELD | Admitting: Registered Nurse

## 2016-06-06 ENCOUNTER — Encounter (HOSPITAL_COMMUNITY): Payer: Self-pay

## 2016-06-06 ENCOUNTER — Encounter (HOSPITAL_COMMUNITY): Admission: EM | Disposition: A | Discharge status: Home / Self Care | Payer: Self-pay | Source: Home / Self Care

## 2016-06-06 ENCOUNTER — Inpatient Hospital Stay (HOSPITAL_COMMUNITY): Payer: BLUE CROSS/BLUE SHIELD

## 2016-06-06 DIAGNOSIS — K8012 Calculus of gallbladder with acute and chronic cholecystitis without obstruction: Secondary | ICD-10-CM | POA: Diagnosis not present

## 2016-06-06 DIAGNOSIS — Z79899 Other long term (current) drug therapy: Secondary | ICD-10-CM | POA: Diagnosis not present

## 2016-06-06 DIAGNOSIS — K8010 Calculus of gallbladder with chronic cholecystitis without obstruction: Secondary | ICD-10-CM | POA: Diagnosis not present

## 2016-06-06 DIAGNOSIS — K8000 Calculus of gallbladder with acute cholecystitis without obstruction: Secondary | ICD-10-CM | POA: Diagnosis present

## 2016-06-06 DIAGNOSIS — Z87891 Personal history of nicotine dependence: Secondary | ICD-10-CM | POA: Diagnosis not present

## 2016-06-06 DIAGNOSIS — I10 Essential (primary) hypertension: Secondary | ICD-10-CM | POA: Diagnosis present

## 2016-06-06 DIAGNOSIS — K810 Acute cholecystitis: Secondary | ICD-10-CM | POA: Diagnosis present

## 2016-06-06 DIAGNOSIS — K819 Cholecystitis, unspecified: Secondary | ICD-10-CM | POA: Diagnosis not present

## 2016-06-06 HISTORY — PX: CHOLECYSTECTOMY: SHX55

## 2016-06-06 LAB — BASIC METABOLIC PANEL
Anion gap: 7 (ref 5–15)
BUN: 19 mg/dL (ref 6–20)
CHLORIDE: 106 mmol/L (ref 101–111)
CO2: 26 mmol/L (ref 22–32)
CREATININE: 1.03 mg/dL — AB (ref 0.44–1.00)
Calcium: 8.3 mg/dL — ABNORMAL LOW (ref 8.9–10.3)
GFR calc non Af Amer: 57 mL/min — ABNORMAL LOW (ref >60)
Glucose, Bld: 110 mg/dL — ABNORMAL HIGH (ref 65–99)
Potassium: 3.7 mmol/L (ref 3.5–5.1)
Sodium: 139 mmol/L (ref 135–145)

## 2016-06-06 LAB — CBC
HEMATOCRIT: 30.3 % — AB (ref 36.0–46.0)
HEMOGLOBIN: 10.1 g/dL — AB (ref 12.0–15.0)
MCH: 29.1 pg (ref 26.0–34.0)
MCHC: 33.3 g/dL (ref 30.0–36.0)
MCV: 87.3 fL (ref 78.0–100.0)
Platelets: 286 10*3/uL (ref 150–400)
RBC: 3.47 MIL/uL — ABNORMAL LOW (ref 3.87–5.11)
RDW: 14.4 % (ref 11.5–15.5)
WBC: 13.7 10*3/uL — ABNORMAL HIGH (ref 4.0–10.5)

## 2016-06-06 LAB — SURGICAL PCR SCREEN
MRSA, PCR: NEGATIVE
STAPHYLOCOCCUS AUREUS: NEGATIVE

## 2016-06-06 LAB — HIV ANTIBODY (ROUTINE TESTING W REFLEX): HIV Screen 4th Generation wRfx: NONREACTIVE

## 2016-06-06 SURGERY — LAPAROSCOPIC CHOLECYSTECTOMY WITH INTRAOPERATIVE CHOLANGIOGRAM
Anesthesia: General | Site: Abdomen

## 2016-06-06 MED ORDER — 0.9 % SODIUM CHLORIDE (POUR BTL) OPTIME
TOPICAL | Status: DC | PRN
  Administered 2016-06-06: 1000 mL

## 2016-06-06 MED ORDER — ONDANSETRON HCL 4 MG/2ML IJ SOLN: 4.0000 mg | Freq: Four times a day (QID) | INTRAMUSCULAR | Status: DC | PRN

## 2016-06-06 MED ORDER — SUCCINYLCHOLINE CHLORIDE 200 MG/10ML IV SOSY
PREFILLED_SYRINGE | INTRAVENOUS | Status: DC | PRN
  Administered 2016-06-06: 100 mg via INTRAVENOUS

## 2016-06-06 MED ORDER — HYDROMORPHONE HCL 1 MG/ML IJ SOLN: 1.0000 mg | INTRAMUSCULAR | Status: DC | PRN

## 2016-06-06 MED ORDER — LIDOCAINE 2% (20 MG/ML) 5 ML SYRINGE
INTRAMUSCULAR | Status: DC | PRN
  Administered 2016-06-06: 100 mg via INTRAVENOUS

## 2016-06-06 MED ORDER — PROMETHAZINE HCL 25 MG/ML IJ SOLN: 6.2500 mg | INTRAMUSCULAR | Status: DC | PRN

## 2016-06-06 MED ORDER — PROPOFOL 10 MG/ML IV BOLUS
INTRAVENOUS | Status: AC
  Filled 2016-06-06: qty 20

## 2016-06-06 MED ORDER — BUPIVACAINE-EPINEPHRINE 0.5% -1:200000 IJ SOLN
INTRAMUSCULAR | Status: DC | PRN
  Administered 2016-06-06: 20 mL

## 2016-06-06 MED ORDER — ONDANSETRON 4 MG PO TBDP: 4.0000 mg | ORAL_TABLET | Freq: Four times a day (QID) | ORAL | Status: DC | PRN

## 2016-06-06 MED ORDER — FENTANYL CITRATE (PF) 250 MCG/5ML IJ SOLN
INTRAMUSCULAR | Status: AC
  Filled 2016-06-06: qty 5

## 2016-06-06 MED ORDER — LACTATED RINGERS IV SOLN: INTRAVENOUS | Status: DC

## 2016-06-06 MED ORDER — CHLORHEXIDINE GLUCONATE CLOTH 2 % EX PADS
6.0000 | MEDICATED_PAD | Freq: Once | CUTANEOUS | Status: AC
  Administered 2016-06-06: 6 via TOPICAL

## 2016-06-06 MED ORDER — POTASSIUM CHLORIDE IN NACL 20-0.9 MEQ/L-% IV SOLN
INTRAVENOUS | Status: DC
  Administered 2016-06-06: 02:00:00 via INTRAVENOUS
  Filled 2016-06-06 (×2): qty 1000

## 2016-06-06 MED ORDER — SUCCINYLCHOLINE CHLORIDE 200 MG/10ML IV SOSY
PREFILLED_SYRINGE | INTRAVENOUS | Status: AC
  Filled 2016-06-06: qty 10

## 2016-06-06 MED ORDER — ONDANSETRON HCL 4 MG/2ML IJ SOLN
INTRAMUSCULAR | Status: DC | PRN
  Administered 2016-06-06: 4 mg via INTRAVENOUS

## 2016-06-06 MED ORDER — SUGAMMADEX SODIUM 200 MG/2ML IV SOLN
INTRAVENOUS | Status: DC | PRN
  Administered 2016-06-06: 180 mg via INTRAVENOUS

## 2016-06-06 MED ORDER — FENTANYL CITRATE (PF) 100 MCG/2ML IJ SOLN
INTRAMUSCULAR | Status: DC | PRN
  Administered 2016-06-06: 50 ug via INTRAVENOUS
  Administered 2016-06-06: 100 ug via INTRAVENOUS
  Administered 2016-06-06: 50 ug via INTRAVENOUS

## 2016-06-06 MED ORDER — PHENYLEPHRINE 40 MCG/ML (10ML) SYRINGE FOR IV PUSH (FOR BLOOD PRESSURE SUPPORT)
PREFILLED_SYRINGE | INTRAVENOUS | Status: DC | PRN
  Administered 2016-06-06 (×3): 80 ug via INTRAVENOUS

## 2016-06-06 MED ORDER — MIDAZOLAM HCL 5 MG/5ML IJ SOLN
INTRAMUSCULAR | Status: DC | PRN
  Administered 2016-06-06: 2 mg via INTRAVENOUS

## 2016-06-06 MED ORDER — IOPAMIDOL (ISOVUE-300) INJECTION 61%
INTRAVENOUS | Status: AC
  Filled 2016-06-06: qty 50

## 2016-06-06 MED ORDER — HYDROMORPHONE HCL 1 MG/ML IJ SOLN: 0.2500 mg | INTRAMUSCULAR | Status: DC | PRN

## 2016-06-06 MED ORDER — ROCURONIUM BROMIDE 50 MG/5ML IV SOSY
PREFILLED_SYRINGE | INTRAVENOUS | Status: AC
  Filled 2016-06-06: qty 5

## 2016-06-06 MED ORDER — HYDROCODONE-ACETAMINOPHEN 5-325 MG PO TABS: 1.0000 | ORAL_TABLET | ORAL | Status: DC | PRN

## 2016-06-06 MED ORDER — IOPAMIDOL (ISOVUE-300) INJECTION 61%
INTRAVENOUS | Status: DC | PRN
  Administered 2016-06-06: 50 mL via INTRAVENOUS

## 2016-06-06 MED ORDER — CEFTRIAXONE SODIUM 2 G IJ SOLR
2.0000 g | INTRAMUSCULAR | Status: DC
  Administered 2016-06-07: 2 g via INTRAVENOUS
  Filled 2016-06-06: qty 2

## 2016-06-06 MED ORDER — MIDAZOLAM HCL 2 MG/2ML IJ SOLN
INTRAMUSCULAR | Status: AC
  Filled 2016-06-06: qty 2

## 2016-06-06 MED ORDER — PROPOFOL 10 MG/ML IV BOLUS
INTRAVENOUS | Status: DC | PRN
  Administered 2016-06-06: 160 mg via INTRAVENOUS

## 2016-06-06 MED ORDER — DEXAMETHASONE SODIUM PHOSPHATE 10 MG/ML IJ SOLN
INTRAMUSCULAR | Status: AC
  Filled 2016-06-06: qty 1

## 2016-06-06 MED ORDER — LACTATED RINGERS IV SOLN
INTRAVENOUS | Status: DC | PRN
  Administered 2016-06-06: 12:00:00 via INTRAVENOUS

## 2016-06-06 MED ORDER — ONDANSETRON HCL 4 MG/2ML IJ SOLN
INTRAMUSCULAR | Status: AC
  Filled 2016-06-06: qty 2

## 2016-06-06 MED ORDER — DEXAMETHASONE SODIUM PHOSPHATE 10 MG/ML IJ SOLN
INTRAMUSCULAR | Status: DC | PRN
  Administered 2016-06-06: 10 mg via INTRAVENOUS

## 2016-06-06 MED ORDER — PHENYLEPHRINE 40 MCG/ML (10ML) SYRINGE FOR IV PUSH (FOR BLOOD PRESSURE SUPPORT)
PREFILLED_SYRINGE | INTRAVENOUS | Status: AC
  Filled 2016-06-06: qty 10

## 2016-06-06 MED ORDER — ROCURONIUM BROMIDE 10 MG/ML (PF) SYRINGE
PREFILLED_SYRINGE | INTRAVENOUS | Status: DC | PRN
  Administered 2016-06-06: 30 mg via INTRAVENOUS

## 2016-06-06 MED ORDER — ACETAMINOPHEN 650 MG RE SUPP
650.0000 mg | Freq: Four times a day (QID) | RECTAL | Status: DC | PRN
  Administered 2016-06-07: 650 mg via RECTAL

## 2016-06-06 MED ORDER — ACETAMINOPHEN 325 MG PO TABS
650.0000 mg | ORAL_TABLET | Freq: Four times a day (QID) | ORAL | Status: DC | PRN
Start: 2016-06-06 — End: 2016-06-07
  Administered 2016-06-06 (×2): 650 mg via ORAL
  Filled 2016-06-06 (×3): qty 2

## 2016-06-06 MED ORDER — SUGAMMADEX SODIUM 200 MG/2ML IV SOLN
INTRAVENOUS | Status: AC
  Filled 2016-06-06: qty 2

## 2016-06-06 MED ORDER — LACTATED RINGERS IR SOLN
Status: DC | PRN
  Administered 2016-06-06: 1000 mL

## 2016-06-06 MED ORDER — CHLORHEXIDINE GLUCONATE CLOTH 2 % EX PADS: 6.0000 | MEDICATED_PAD | Freq: Once | CUTANEOUS | Status: DC

## 2016-06-06 MED ORDER — HEPARIN SODIUM (PORCINE) 5000 UNIT/ML IJ SOLN
5000.0000 [IU] | Freq: Once | INTRAMUSCULAR | Status: AC
  Administered 2016-06-06: 5000 [IU] via SUBCUTANEOUS
  Filled 2016-06-06: qty 1

## 2016-06-06 MED ORDER — BUPIVACAINE-EPINEPHRINE (PF) 0.5% -1:200000 IJ SOLN
INTRAMUSCULAR | Status: AC
  Filled 2016-06-06: qty 30

## 2016-06-06 MED ORDER — DEXTROSE 5 % IV SOLN
2.0000 g | INTRAVENOUS | Status: DC
  Administered 2016-06-06: 2 g via INTRAVENOUS
  Filled 2016-06-06 (×2): qty 2

## 2016-06-06 MED ORDER — MEPERIDINE HCL 50 MG/ML IJ SOLN: 6.2500 mg | INTRAMUSCULAR | Status: DC | PRN

## 2016-06-06 MED ORDER — PANTOPRAZOLE SODIUM 40 MG IV SOLR
40.0000 mg | Freq: Every day | INTRAVENOUS | Status: DC
  Administered 2016-06-06: 40 mg via INTRAVENOUS
  Filled 2016-06-06: qty 40

## 2016-06-06 MED ORDER — LIDOCAINE 2% (20 MG/ML) 5 ML SYRINGE
INTRAMUSCULAR | Status: AC
  Filled 2016-06-06: qty 5

## 2016-06-06 MED ORDER — KCL IN DEXTROSE-NACL 20-5-0.45 MEQ/L-%-% IV SOLN
INTRAVENOUS | Status: DC
  Administered 2016-06-06 (×2): via INTRAVENOUS
  Filled 2016-06-06: qty 1000

## 2016-06-06 SURGICAL SUPPLY — 33 items
APPLIER CLIP ROT 10 11.4 M/L (STAPLE) ×2
CABLE HIGH FREQUENCY MONO STRZ (ELECTRODE) ×2 IMPLANT
CHLORAPREP W/TINT 26ML (MISCELLANEOUS) ×4 IMPLANT
CLIP APPLIE ROT 10 11.4 M/L (STAPLE) ×1 IMPLANT
COVER MAYO STAND STRL (DRAPES) ×2 IMPLANT
COVER SURGICAL LIGHT HANDLE (MISCELLANEOUS) IMPLANT
DECANTER SPIKE VIAL GLASS SM (MISCELLANEOUS) IMPLANT
DRAPE C-ARM 42X120 X-RAY (DRAPES) ×2 IMPLANT
ELECT REM PT RETURN 9FT ADLT (ELECTROSURGICAL) ×2
ELECTRODE REM PT RTRN 9FT ADLT (ELECTROSURGICAL) ×1 IMPLANT
GAUZE SPONGE 2X2 8PLY STRL LF (GAUZE/BANDAGES/DRESSINGS) ×1 IMPLANT
GLOVE SURG ORTHO 8.0 STRL STRW (GLOVE) ×2 IMPLANT
GOWN STRL REUS W/TWL XL LVL3 (GOWN DISPOSABLE) ×4 IMPLANT
HEMOSTAT SURGICEL 4X8 (HEMOSTASIS) IMPLANT
IRRIG SUCT STRYKERFLOW 2 WTIP (MISCELLANEOUS)
IRRIGATION SUCT STRKRFLW 2 WTP (MISCELLANEOUS) IMPLANT
KIT BASIN OR (CUSTOM PROCEDURE TRAY) ×2 IMPLANT
POUCH SPECIMEN RETRIEVAL 10MM (ENDOMECHANICALS) ×2 IMPLANT
SCISSORS LAP 5X35 DISP (ENDOMECHANICALS) ×2 IMPLANT
SET CHOLANGIOGRAPH MIX (MISCELLANEOUS) ×2 IMPLANT
SET IRRIG TUBING LAPAROSCOPIC (IRRIGATION / IRRIGATOR) ×2 IMPLANT
SLEEVE XCEL OPT CAN 5 100 (ENDOMECHANICALS) ×2 IMPLANT
SPONGE GAUZE 2X2 STER 10/PKG (GAUZE/BANDAGES/DRESSINGS) ×1
STRIP CLOSURE SKIN 1/2X4 (GAUZE/BANDAGES/DRESSINGS) ×2 IMPLANT
SUT MNCRL AB 4-0 PS2 18 (SUTURE) ×2 IMPLANT
TAPE CLOTH SURG 4X10 WHT LF (GAUZE/BANDAGES/DRESSINGS) ×2 IMPLANT
TOWEL OR 17X26 10 PK STRL BLUE (TOWEL DISPOSABLE) ×2 IMPLANT
TOWEL OR NON WOVEN STRL DISP B (DISPOSABLE) ×2 IMPLANT
TRAY LAPAROSCOPIC (CUSTOM PROCEDURE TRAY) ×2 IMPLANT
TROCAR BLADELESS OPT 5 100 (ENDOMECHANICALS) ×2 IMPLANT
TROCAR XCEL BLUNT TIP 100MML (ENDOMECHANICALS) ×2 IMPLANT
TROCAR XCEL NON-BLD 11X100MML (ENDOMECHANICALS) ×2 IMPLANT
TUBING INSUF HEATED (TUBING) ×2 IMPLANT

## 2016-06-06 NOTE — Anesthesia Procedure Notes (Signed)
Procedure Name: Intubation Date/Time: 06/06/2016 12:04 PM Performed by: Noralyn Pick D Pre-anesthesia Checklist: Patient identified, Emergency Drugs available, Suction available and Patient being monitored Patient Re-evaluated:Patient Re-evaluated prior to inductionOxygen Delivery Method: Circle system utilized Preoxygenation: Pre-oxygenation with 100% oxygen Intubation Type: IV induction, Rapid sequence and Cricoid Pressure applied Ventilation: Mask ventilation without difficulty Laryngoscope Size: Mac and 4 Grade View: Grade III Tube type: Oral Number of attempts: 1 Airway Equipment and Method: Stylet Placement Confirmation: ETT inserted through vocal cords under direct vision,  positive ETCO2 and breath sounds checked- equal and bilateral Secured at: 21 cm Tube secured with: Tape Dental Injury: Teeth and Oropharynx as per pre-operative assessment

## 2016-06-06 NOTE — Anesthesia Preprocedure Evaluation (Addendum)
Anesthesia Evaluation  Patient identified by MRN, date of birth, ID band Patient awake    Reviewed: Allergy & Precautions, NPO status , Patient's Chart, lab work & pertinent test results  Airway Mallampati: II  TM Distance: >3 FB Neck ROM: Full    Dental no notable dental hx.    Pulmonary former smoker,    Pulmonary exam normal breath sounds clear to auscultation       Cardiovascular hypertension, Pt. on medications Normal cardiovascular exam Rhythm:Regular Rate:Normal     Neuro/Psych negative neurological ROS  negative psych ROS   GI/Hepatic negative GI ROS, Neg liver ROS,   Endo/Other  negative endocrine ROS  Renal/GU negative Renal ROS     Musculoskeletal negative musculoskeletal ROS (+)   Abdominal   Peds  Hematology negative hematology ROS (+)   Anesthesia Other Findings   Reproductive/Obstetrics negative OB ROS                            Anesthesia Physical Anesthesia Plan  ASA: II  Anesthesia Plan: General   Post-op Pain Management:    Induction: Intravenous  Airway Management Planned: Oral ETT  Additional Equipment:   Intra-op Plan:   Post-operative Plan: Extubation in OR  Informed Consent: I have reviewed the patients History and Physical, chart, labs and discussed the procedure including the risks, benefits and alternatives for the proposed anesthesia with the patient or authorized representative who has indicated his/her understanding and acceptance.   Dental advisory given  Plan Discussed with: CRNA  Anesthesia Plan Comments:         Anesthesia Quick Evaluation

## 2016-06-06 NOTE — Op Note (Signed)
Procedure Note  Pre-operative Diagnosis:  Acute cholecystitis, cholelithiasis  Post-operative Diagnosis:  same  Surgeon:  Earnstine Regal, MD, FACS  Assistant:  none   Procedure:  Laparoscopic cholecystectomy with intra-operative cholangiography  Anesthesia:  General  Estimated Blood Loss:  minimal  Drains: none         Specimen: Gallbladder to pathology  Indications:  Patient is a 61 yo WF admitted with acute cholecystitis, cholelithiasis, and abdominal pain.  Now for cholecystectomy.  Procedure Details:  The patient was seen in the pre-op holding area. The risks, benefits, complications, treatment options, and expected outcomes were previously discussed with the patient. The patient agreed with the proposed plan and has signed the informed consent form.  The patient was brought to the Operating Room, identified as Baruch Merl and the procedure verified as laparoscopic cholecystectomy with intraoperative cholangiography. A "time out" was completed and the above information confirmed.  The patient was placed in the supine position. Following induction of general anesthesia, the abdomen was prepped and draped in the usual aseptic fashion.  An incision was made in the skin near the umbilicus. The midline fascia was incised and the peritoneal cavity was entered and a Hasson canula was introduced under direct vision.  The Hasson canula was secured with a 0-Vicryl pursestring suture. Pneumoperitoneum was established with carbon dioxide. Additional trocars were introduced under direct vision along the right costal margin in the midline, mid-clavicular line, and anterior axillary line.   The gallbladder was identified and the fundus grasped and retracted cephalad. Adhesions were taken down bluntly and the electrocautery was utilized as needed, taking care not to injure any adjacent structures. The infundibulum was grasped and retracted laterally, exposing the peritoneum overlying the  triangle of Calot. The peritoneum was incised and structures exposed with blunt dissection. The cystic duct was clearly identified, bluntly dissected circumferentially, and clipped at the neck of the gallbladder.  An incision was made in the cystic duct and the cholangiogram catheter introduced. The catheter was secured using an ligaclip.  Real-time cholangiography was performed using C-arm fluoroscopy.  There was rapid filling of a normal caliber common bile duct.  There was reflux of contrast into the left and right hepatic ductal systems.  There was free flow distally into the duodenum without filling defect or obstruction.  The catheter was removed from the peritoneal cavity.  The cystic duct was then ligated with surgical clips and divided. The cystic artery was identified, dissected circumferentially, ligated with ligaclips, and divided.  The gallbladder was dissected away from the liver bed using the electrocautery for hemostasis. The gallbladder was completely removed from the liver and placed into an endocatch bag. The gallbladder was removed in the endocatch bag through the umbilical port site and submitted to pathology for review.  The right upper quadrant was irrigated and the gallbladder bed was inspected. Hemostasis was achieved with the electrocautery.  Pneumoperitoneum was released after viewing removal of the trocars with good hemostasis noted. The umbilical wound was irrigated and the fascia was then closed with the pursestring suture.  Local anesthetic was infiltrated at all port sites. The skin incisions were closed with 4-0 Monocril subcuticular sutures and steri-strips and dressings were applied.  Instrument, sponge, and needle counts were correct at the conclusion of the case.  The patient was awakened from anesthesia and brought to the recovery room in stable condition.  The patient tolerated the procedure well.   Earnstine Regal, MD, [facility] Office:  [phone]   

## 2016-06-06 NOTE — Transfer of Care (Signed)
Immediate Anesthesia Transfer of Care Note  Patient: Helen Hughes  Procedure(s) Performed: Procedure(s): LAPAROSCOPIC CHOLECYSTECTOMY WITH INTRAOPERATIVE CHOLANGIOGRAM (N/A)  Patient Location: PACU  Anesthesia Type:General  Level of Consciousness: awake, alert , oriented and patient cooperative  Airway & Oxygen Therapy: Patient Spontanous Breathing and Patient connected to face mask oxygen  Post-op Assessment: Report given to RN, Post -op Vital signs reviewed and stable and Patient moving all extremities  Post vital signs: Reviewed and stable  Last Vitals:  Vitals:   06/06/16 0520 06/06/16 0631  BP: 111/59 126/63  Pulse: 92 98  Resp: 18 18  Temp:  37.4 C    Last Pain:  Vitals:   06/06/16 1151  TempSrc:   PainSc: 3       Patients Stated Pain Goal: 2 (18/98/42 1031)  Complications: No apparent anesthesia complications

## 2016-06-06 NOTE — Anesthesia Postprocedure Evaluation (Signed)
Anesthesia Post Note  Patient: Helen Hughes  Procedure(s) Performed: Procedure(s) (LRB): LAPAROSCOPIC CHOLECYSTECTOMY WITH INTRAOPERATIVE CHOLANGIOGRAM (N/A)  Patient location during evaluation: PACU Anesthesia Type: General Level of consciousness: sedated and patient cooperative Pain management: pain level controlled Vital Signs Assessment: post-procedure vital signs reviewed and stable Respiratory status: spontaneous breathing Cardiovascular status: stable Anesthetic complications: no       Last Vitals:  Vitals:   06/06/16 1651 06/06/16 1727  BP: (!) 123/59 (!) 109/55  Pulse: 85 80  Resp: 16 16  Temp: 37.3 C 37.2 C    Last Pain:  Vitals:   06/06/16 1727  TempSrc: Oral  PainSc:                  [name]

## 2016-06-06 NOTE — Progress Notes (Signed)
General Surgery [facility]  Assessment & Plan:  HD#2 - acute cholecystitis, cholelithiasis, abdominal pain  IV Rocephin started last night  Plan OR today for lap chole with IOC  Pain Rx, nausea Rx  The risks and benefits of the procedure have been discussed at length with the patient.  The patient understands the proposed procedure, potential alternative treatments, and the course of recovery to be expected.  All of the patient's questions have been answered at this time.  The patient wishes to proceed with surgery.        Earnstine Regal, MD, [facility]       Office: [phone]    Subjective: Patient in bed, nursing in room.  Complains of pain.  Objective: Vital signs in last 24 hours: Temp:  [98.1 F (36.7 C)-99.3 F (37.4 C)] 99.3 F (37.4 C) (03/14 0631) Pulse Rate:  [91-104] 98 (03/14 0631) Resp:  [12-20] 18 (03/14 0631) BP: (105-156)/(52-84) 126/63 (03/14 0631) SpO2:  [94 %-100 %] 96 % (03/14 0631)    Intake/Output from previous day: 03/13 0701 - 03/14 0700 In: 2039.6 [I.V.:639.6; IV Piggyback:1400] Out: 300 [Urine:300] Intake/Output this shift: No intake/output data recorded.  Physical Exam: HEENT - sclerae clear, mucous membranes moist Neck - soft Abdomen - soft without distension; tenderness in epigastrium and RUQ, moderate with guarding Ext - no edema, non-tender Neuro - alert & oriented, no focal deficits  Lab Results:   Recent Labs  06/05/16 1417 06/06/16 0520  WBC 17.4* 13.7*  HGB 12.6 10.1*  HCT 37.3 30.3*  PLT 351 286   BMET  Recent Labs  06/05/16 1417 06/06/16 0520  NA 138 139  K 3.8 3.7  CL 102 106  CO2 27 26  GLUCOSE 126* 110*  BUN 22* 19  CREATININE 1.04* 1.03*  CALCIUM 9.5 8.3*   PT/INR No results for input(s): LABPROT, INR in the last 72 hours. Comprehensive Metabolic Panel:    Component Value Date/Time   NA 139 06/06/2016 0520   NA 138 06/05/2016 1417   K 3.7  06/06/2016 0520   K 3.8 06/05/2016 1417   CL 106 06/06/2016 0520   CL 102 06/05/2016 1417   CO2 26 06/06/2016 0520   CO2 27 06/05/2016 1417   BUN 19 06/06/2016 0520   BUN 22 (H) 06/05/2016 1417   CREATININE 1.03 (H) 06/06/2016 0520   CREATININE 1.04 (H) 06/05/2016 1417   GLUCOSE 110 (H) 06/06/2016 0520   GLUCOSE 126 (H) 06/05/2016 1417   CALCIUM 8.3 (L) 06/06/2016 0520   CALCIUM 9.5 06/05/2016 1417   AST 27 06/05/2016 1417   ALT 21 06/05/2016 1417   ALKPHOS 59 06/05/2016 1417   BILITOT 0.6 06/05/2016 1417   PROT 7.4 06/05/2016 1417   ALBUMIN 3.8 06/05/2016 1417    Studies/Results: Ct Abdomen Pelvis W Contrast  Result Date: 06/05/2016 CLINICAL DATA:  61 year old female with abdominal pain and vomiting since last night. Initial encounter. EXAM: CT ABDOMEN AND PELVIS WITH CONTRAST TECHNIQUE: Multidetector CT imaging of the abdomen and pelvis was performed using the standard protocol following bolus administration of intravenous contrast. CONTRAST:  18mL ISOVUE-300 IOPAMIDOL (ISOVUE-300) INJECTION 61% COMPARISON:  None. FINDINGS: Lower chest: Minimal atelectasis/ scarring lung bases. Heart size within normal limits. Hepatobiliary: Liver enlarged spanning over 20 cm with fatty infiltration without focal worrisome mass. Abnormal appearance of the gallbladder suggestive of cholecystitis. Pericholecystic fluid versus gallbladder wall thickening. Gallstones may be present but are  not calcified. Pancreas: No pancreatic mass, inflammation or pancreatic duct dilation. Spleen: No splenic lesion or enlargement. Adrenals/Urinary Tract: No adrenal lesion. No renal or ureteral obstructing stone or evidence hydronephrosis bilateral renal low-density structures, larger ones can be confirmed as cysts whereas others are too small to adequately characterize. There is a 6.8 mm lesion within the superior medial aspect of the right kidney which has slightly thickened borders. Dedicated contrast enhanced renal MR  recommended for further delineation. Noncontrast filled views of the urinary bladder unremarkable. Stomach/Bowel: Small hiatal hernia. Partially decompressed gastric fundus without gross abnormality. Portions of the colon are underdistended limiting evaluation however, no extraluminal bowel inflammatory process detected. The small amount of free fluid in the pelvis is probably related to the cholecystitis. Appendix not clearly delineated. Vascular/Lymphatic: Atherosclerotic changes abdominal aorta without aneurysmal dilation or major vessel occlusion. No adenopathy. Reproductive: Post hysterectomy. Previously noted pelvic cystic lesion may have been surgically resected. Other: Trace amount of free fluid in the pelvis felt to be related to cholecystitis. Musculoskeletal: Degenerative changes thoracic and lumbar spine including gas containing left paracentral L3-4 cephalad extending disc protrusion. IMPRESSION: Abnormal appearance of the gallbladder suggestive of cholecystitis. Pericholecystic fluid versus gallbladder wall thickening. Gallstones may be present but are not calcified. Trace amount of free fluid in the pelvis felt to be related to cholecystitis. Liver enlarged spanning over 20 cm with fatty infiltration. 6.8 mm lesion within the superior medial aspect of the right kidney which has slightly thickened borders and slightly hyperdense. Dedicated contrast enhanced renal MR recommended for further delineation. Post hysterectomy. Previously noted pelvic cystic lesion may have been surgically resected. No primary bowel inflammatory process.  Appendix not visualized. Degenerative changes thoracic and lumbar spine including gas containing left paracentral L3-4 cephalad extending disc protrusion. These results were called by telephone at the time of interpretation on 06/05/2016 at 4:56 pm to Will Dansie, ER PA, who verbally acknowledged these results. Electronically Signed   By: Genia Del M.D.   On: 06/05/2016  17:20   US Abdomen Limited Ruq  Result Date: 06/05/2016 CLINICAL DATA:  Abdomen pain for 3 days EXAM: US ABDOMEN LIMITED - RIGHT UPPER QUADRANT COMPARISON:  CT 06/05/2016 FINDINGS: Gallbladder: Multiple shadowing stones in the gallbladder lumen, measuring up to 1.7 cm. Marked wall thickening up to 7.7 mm. No sonographic Murphy's. Common bile duct: Diameter: Normal at 4.2 mm Liver: No focal lesion identified. Within normal limits in parenchymal echogenicity. IMPRESSION: 1. Multiple calcified stones within the gallbladder with marked wall thickening up to 7.7 mm. Findings could be secondary to acute or chronic cholecystitis. Sonographer reports negative sonographic Murphy. 2. No biliary dilatation. Electronically Signed   By: Donavan Foil M.D.   On: 06/05/2016 20:35      Helen Hughes Jerilynn Mages 06/06/2016

## 2016-06-06 NOTE — ED Notes (Signed)
Patient ambulated to bathroom.

## 2016-06-07 NOTE — Progress Notes (Signed)
Patient discharged to home with family, discharge instructions reviewed with patient who verbalized understanding. No new RX for patient. 

## 2016-06-07 NOTE — Discharge Summary (Signed)
[facility] Discharge Summary   Patient ID: Helen Hughes MRN: [id number] DOB/AGE: May 19, 1955 61 y.o.  Admit date: 06/05/2016 Discharge date: 06/07/2016  Admitting Diagnosis: Cholecystitis  Discharge Diagnosis Patient Active Problem List   Diagnosis Date Noted  . Acute cholecystitis 06/06/2016    Consultants None  Imaging: Dg Cholangiogram Operative  Result Date: 06/06/2016 CLINICAL DATA:  Calculus cholecystitis EXAM: INTRAOPERATIVE CHOLANGIOGRAM TECHNIQUE: Cholangiographic images from the C-arm fluoroscopic device were submitted for interpretation post-operatively. Please see the procedural report for the amount of contrast and the fluoroscopy time utilized. COMPARISON:  06/05/2016 FINDINGS: Intraoperative cholangiogram performed during the laparoscopic cholecystectomy. The cystic duct, biliary confluence, common hepatic duct, and common bile duct appear patent. Contrast drains easily into the duodenum. No dilatation, obstruction, stricture, or significant filling defect. IMPRESSION: Patent biliary system. Electronically Signed   By: Jerilynn Mages.  Shick M.D.   On: 06/06/2016 17:08   Ct Abdomen Pelvis W Contrast  Result Date: 06/05/2016 CLINICAL DATA:  61 year old female with abdominal pain and vomiting since last night. Initial encounter. EXAM: CT ABDOMEN AND PELVIS WITH CONTRAST TECHNIQUE: Multidetector CT imaging of the abdomen and pelvis was performed using the standard protocol following bolus administration of intravenous contrast. CONTRAST:  161mL ISOVUE-300 IOPAMIDOL (ISOVUE-300) INJECTION 61% COMPARISON:  None. FINDINGS: Lower chest: Minimal atelectasis/ scarring lung bases. Heart size within normal limits. Hepatobiliary: Liver enlarged spanning over 20 cm with fatty infiltration without focal worrisome mass. Abnormal appearance of the gallbladder suggestive of cholecystitis. Pericholecystic fluid versus gallbladder wall thickening. Gallstones may be present but are not  calcified. Pancreas: No pancreatic mass, inflammation or pancreatic duct dilation. Spleen: No splenic lesion or enlargement. Adrenals/Urinary Tract: No adrenal lesion. No renal or ureteral obstructing stone or evidence hydronephrosis bilateral renal low-density structures, larger ones can be confirmed as cysts whereas others are too small to adequately characterize. There is a 6.8 mm lesion within the superior medial aspect of the right kidney which has slightly thickened borders. Dedicated contrast enhanced renal MR recommended for further delineation. Noncontrast filled views of the urinary bladder unremarkable. Stomach/Bowel: Small hiatal hernia. Partially decompressed gastric fundus without gross abnormality. Portions of the colon are underdistended limiting evaluation however, no extraluminal bowel inflammatory process detected. The small amount of free fluid in the pelvis is probably related to the cholecystitis. Appendix not clearly delineated. Vascular/Lymphatic: Atherosclerotic changes abdominal aorta without aneurysmal dilation or major vessel occlusion. No adenopathy. Reproductive: Post hysterectomy. Previously noted pelvic cystic lesion may have been surgically resected. Other: Trace amount of free fluid in the pelvis felt to be related to cholecystitis. Musculoskeletal: Degenerative changes thoracic and lumbar spine including gas containing left paracentral L3-4 cephalad extending disc protrusion. IMPRESSION: Abnormal appearance of the gallbladder suggestive of cholecystitis. Pericholecystic fluid versus gallbladder wall thickening. Gallstones may be present but are not calcified. Trace amount of free fluid in the pelvis felt to be related to cholecystitis. Liver enlarged spanning over 20 cm with fatty infiltration. 6.8 mm lesion within the superior medial aspect of the right kidney which has slightly thickened borders and slightly hyperdense. Dedicated contrast enhanced renal MR recommended for further  delineation. Post hysterectomy. Previously noted pelvic cystic lesion may have been surgically resected. No primary bowel inflammatory process.  Appendix not visualized. Degenerative changes thoracic and lumbar spine including gas containing left paracentral L3-4 cephalad extending disc protrusion. These results were called by telephone at the time of interpretation on 06/05/2016 at 4:56 pm to Will Dansie, ER PA, who verbally acknowledged these results. Electronically Signed  By: Genia Del M.D.   On: 06/05/2016 17:20   US Abdomen Limited Ruq  Result Date: 06/05/2016 CLINICAL DATA:  Abdomen pain for 3 days EXAM: US ABDOMEN LIMITED - RIGHT UPPER QUADRANT COMPARISON:  CT 06/05/2016 FINDINGS: Gallbladder: Multiple shadowing stones in the gallbladder lumen, measuring up to 1.7 cm. Marked wall thickening up to 7.7 mm. No sonographic Murphy's. Common bile duct: Diameter: Normal at 4.2 mm Liver: No focal lesion identified. Within normal limits in parenchymal echogenicity. IMPRESSION: 1. Multiple calcified stones within the gallbladder with marked wall thickening up to 7.7 mm. Findings could be secondary to acute or chronic cholecystitis. Sonographer reports negative sonographic Murphy. 2. No biliary dilatation. Electronically Signed   By: Donavan Foil M.D.   On: 06/05/2016 20:35    Procedures Dr. Harlow Asa (06/06/16) - Laparoscopic Cholecystectomy with [place] Hospital Course:  Helen Hughes is a 61yo female who presented to [facility] 06/05/16 with 2 days of epigastric abdominal pain.  U/s showed acute cholecystitis.  Patient was admitted and underwent procedure listed above.  Tolerated procedure well and was transferred to the floor.  Diet was advanced as tolerated.  On POD1 the patient was voiding well, tolerating diet, ambulating well, pain well controlled, vital signs stable, incisions c/d/i and felt stable for discharge home.  Patient will follow up in our office in 3 weeks and knows to call with questions or  concerns.       Physical Exam: General:  Alert, NAD, pleasant, comfortable Pulm: effort normal Abd:  Soft, ND, appropriately tender, multiple lap incisions C/D/I with clean/dry dressings  Allergies as of 06/07/2016      Reactions   Penicillins Rash      Medication List    TAKE these medications   b complex vitamins capsule Take 1 capsule by mouth daily.   cholecalciferol 1000 units tablet Commonly known as:  VITAMIN D Take 1,000 Units by mouth daily.   losartan-hydrochlorothiazide 100-25 MG tablet Commonly known as:  HYZAAR Take 1 tablet by mouth every morning.   magnesium 30 MG tablet Take 30 mg by mouth daily.   RED YEAST RICE PO Take 1 capsule by mouth 2 (two) times daily.        Follow-up [facility], [place]. Go on 06/26/2016.   Specialty:  General Surgery Why:  Your appointment is 06/26/16 @ 11:45AM. Please arrive 30 minutes prior to your appointment to check in and fill out necessary paperwork. Contact information: [address] [place] [phone]          Signed: Jerrye Beavers, [facility] 06/07/2016, 8:03 AM Pager: [phone] Consults: [phone] Mon-Fri 7:00 am-4:30 pm Sat-Sun 7:00 am-11:30 am

## 2016-06-11 ENCOUNTER — Other Ambulatory Visit: Payer: Self-pay | Admitting: Family Medicine

## 2016-06-11 DIAGNOSIS — N2889 Other specified disorders of kidney and ureter: Secondary | ICD-10-CM

## 2016-06-27 ENCOUNTER — Ambulatory Visit
Admission: RE | Admit: 2016-06-27 | Discharge: 2016-06-27 | Disposition: A | Discharge status: Home / Self Care | Payer: BLUE CROSS/BLUE SHIELD | Source: Ambulatory Visit | Attending: Family Medicine | Admitting: Family Medicine

## 2016-06-27 DIAGNOSIS — K7689 Other specified diseases of liver: Secondary | ICD-10-CM | POA: Diagnosis not present

## 2016-06-27 DIAGNOSIS — N2889 Other specified disorders of kidney and ureter: Secondary | ICD-10-CM

## 2016-06-27 MED ORDER — GADOBENATE DIMEGLUMINE 529 MG/ML IV SOLN
15.0000 mL | Freq: Once | INTRAVENOUS | Status: AC | PRN
  Administered 2016-06-27: 15 mL via INTRAVENOUS

## 2016-06-29 ENCOUNTER — Other Ambulatory Visit: Payer: BLUE CROSS/BLUE SHIELD

## 2016-07-05 DIAGNOSIS — I129 Hypertensive chronic kidney disease with stage 1 through stage 4 chronic kidney disease, or unspecified chronic kidney disease: Secondary | ICD-10-CM | POA: Diagnosis not present

## 2016-07-05 DIAGNOSIS — N183 Chronic kidney disease, stage 3 (moderate): Secondary | ICD-10-CM | POA: Diagnosis not present

## 2016-07-05 DIAGNOSIS — D509 Iron deficiency anemia, unspecified: Secondary | ICD-10-CM | POA: Diagnosis not present

## 2016-07-05 DIAGNOSIS — E782 Mixed hyperlipidemia: Secondary | ICD-10-CM | POA: Diagnosis not present

## 2016-07-18 DIAGNOSIS — H527 Unspecified disorder of refraction: Secondary | ICD-10-CM | POA: Diagnosis not present

## 2016-07-24 DIAGNOSIS — N183 Chronic kidney disease, stage 3 (moderate): Secondary | ICD-10-CM | POA: Diagnosis not present

## 2016-07-24 DIAGNOSIS — E559 Vitamin D deficiency, unspecified: Secondary | ICD-10-CM | POA: Diagnosis not present

## 2016-08-13 DIAGNOSIS — Z713 Dietary counseling and surveillance: Secondary | ICD-10-CM | POA: Diagnosis not present

## 2016-09-11 DIAGNOSIS — N183 Chronic kidney disease, stage 3 (moderate): Secondary | ICD-10-CM | POA: Diagnosis not present

## 2016-11-12 DIAGNOSIS — B351 Tinea unguium: Secondary | ICD-10-CM | POA: Diagnosis not present

## 2016-11-12 DIAGNOSIS — M21621 Bunionette of right foot: Secondary | ICD-10-CM | POA: Diagnosis not present

## 2016-11-12 DIAGNOSIS — M7741 Metatarsalgia, right foot: Secondary | ICD-10-CM | POA: Diagnosis not present

## 2016-11-12 DIAGNOSIS — M205X1 Other deformities of toe(s) (acquired), right foot: Secondary | ICD-10-CM | POA: Diagnosis not present

## 2016-12-05 DIAGNOSIS — M9902 Segmental and somatic dysfunction of thoracic region: Secondary | ICD-10-CM | POA: Diagnosis not present

## 2016-12-05 DIAGNOSIS — M9903 Segmental and somatic dysfunction of lumbar region: Secondary | ICD-10-CM | POA: Diagnosis not present

## 2016-12-05 DIAGNOSIS — M9901 Segmental and somatic dysfunction of cervical region: Secondary | ICD-10-CM | POA: Diagnosis not present

## 2016-12-05 DIAGNOSIS — M9904 Segmental and somatic dysfunction of sacral region: Secondary | ICD-10-CM | POA: Diagnosis not present

## 2017-01-02 DIAGNOSIS — M9901 Segmental and somatic dysfunction of cervical region: Secondary | ICD-10-CM | POA: Diagnosis not present

## 2017-01-02 DIAGNOSIS — M9904 Segmental and somatic dysfunction of sacral region: Secondary | ICD-10-CM | POA: Diagnosis not present

## 2017-01-02 DIAGNOSIS — M9902 Segmental and somatic dysfunction of thoracic region: Secondary | ICD-10-CM | POA: Diagnosis not present

## 2017-01-02 DIAGNOSIS — M9903 Segmental and somatic dysfunction of lumbar region: Secondary | ICD-10-CM | POA: Diagnosis not present

## 2017-01-10 DIAGNOSIS — N183 Chronic kidney disease, stage 3 (moderate): Secondary | ICD-10-CM | POA: Diagnosis not present

## 2017-01-10 DIAGNOSIS — I129 Hypertensive chronic kidney disease with stage 1 through stage 4 chronic kidney disease, or unspecified chronic kidney disease: Secondary | ICD-10-CM | POA: Diagnosis not present

## 2017-01-11 DIAGNOSIS — K219 Gastro-esophageal reflux disease without esophagitis: Secondary | ICD-10-CM | POA: Diagnosis not present

## 2017-01-11 DIAGNOSIS — E559 Vitamin D deficiency, unspecified: Secondary | ICD-10-CM | POA: Diagnosis not present

## 2017-01-11 DIAGNOSIS — I10 Essential (primary) hypertension: Secondary | ICD-10-CM | POA: Diagnosis not present

## 2017-01-11 DIAGNOSIS — E782 Mixed hyperlipidemia: Secondary | ICD-10-CM | POA: Diagnosis not present

## 2017-01-11 DIAGNOSIS — N183 Chronic kidney disease, stage 3 (moderate): Secondary | ICD-10-CM | POA: Diagnosis not present

## 2017-01-28 DIAGNOSIS — B351 Tinea unguium: Secondary | ICD-10-CM | POA: Diagnosis not present

## 2017-03-11 DIAGNOSIS — Z13820 Encounter for screening for osteoporosis: Secondary | ICD-10-CM | POA: Diagnosis not present

## 2017-03-21 DIAGNOSIS — L814 Other melanin hyperpigmentation: Secondary | ICD-10-CM | POA: Diagnosis not present

## 2017-03-21 DIAGNOSIS — D225 Melanocytic nevi of trunk: Secondary | ICD-10-CM | POA: Diagnosis not present

## 2017-03-21 DIAGNOSIS — D1801 Hemangioma of skin and subcutaneous tissue: Secondary | ICD-10-CM | POA: Diagnosis not present

## 2017-03-21 DIAGNOSIS — L820 Inflamed seborrheic keratosis: Secondary | ICD-10-CM | POA: Diagnosis not present

## 2017-07-24 DIAGNOSIS — I10 Essential (primary) hypertension: Secondary | ICD-10-CM | POA: Diagnosis not present

## 2017-07-24 DIAGNOSIS — E559 Vitamin D deficiency, unspecified: Secondary | ICD-10-CM | POA: Diagnosis not present

## 2017-07-24 DIAGNOSIS — Z1159 Encounter for screening for other viral diseases: Secondary | ICD-10-CM | POA: Diagnosis not present

## 2017-07-24 DIAGNOSIS — H527 Unspecified disorder of refraction: Secondary | ICD-10-CM | POA: Diagnosis not present

## 2017-07-24 DIAGNOSIS — N183 Chronic kidney disease, stage 3 (moderate): Secondary | ICD-10-CM | POA: Diagnosis not present

## 2017-07-24 DIAGNOSIS — E782 Mixed hyperlipidemia: Secondary | ICD-10-CM | POA: Diagnosis not present

## 2017-07-24 DIAGNOSIS — K219 Gastro-esophageal reflux disease without esophagitis: Secondary | ICD-10-CM | POA: Diagnosis not present

## 2017-11-08 DIAGNOSIS — E559 Vitamin D deficiency, unspecified: Secondary | ICD-10-CM | POA: Diagnosis not present

## 2017-11-08 DIAGNOSIS — D509 Iron deficiency anemia, unspecified: Secondary | ICD-10-CM | POA: Diagnosis not present

## 2017-11-08 DIAGNOSIS — R252 Cramp and spasm: Secondary | ICD-10-CM | POA: Diagnosis not present

## 2017-11-08 DIAGNOSIS — R5383 Other fatigue: Secondary | ICD-10-CM | POA: Diagnosis not present

## 2018-01-07 IMAGING — RF DG CHOLANGIOGRAM OPERATIVE
1 series · 4 of 4 positions shown · non-contrast
Comparison: 06/05/2016

CLINICAL DATA: Calculus cholecystitis

EXAM:
INTRAOPERATIVE CHOLANGIOGRAM
TECHNIQUE: Cholangiographic images from the C-arm fluoroscopic device were
submitted for interpretation post-operatively. Please see the
procedural report for the amount of contrast and the fluoroscopy
time utilized.

[Series 1: run · 4 of 121 frames shown]
[frame 19/121]
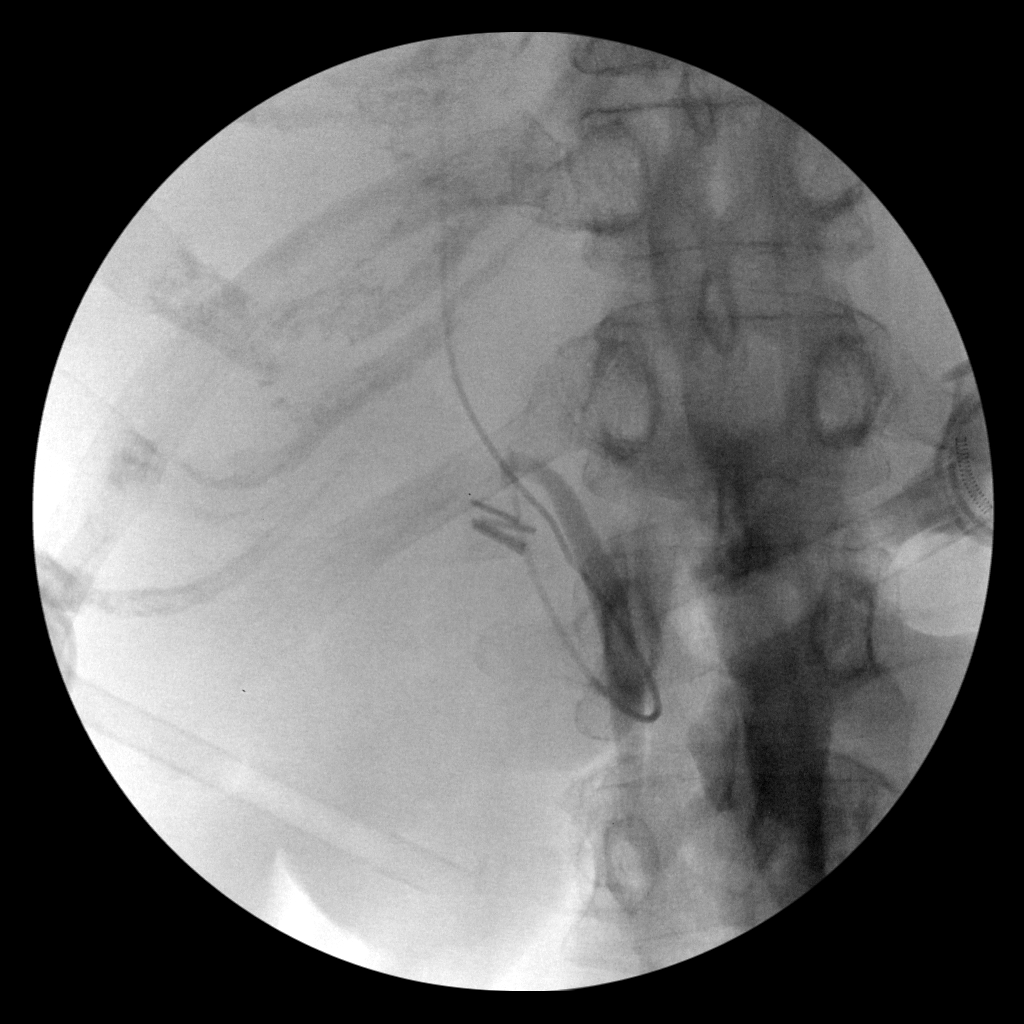
[frame 23/121]
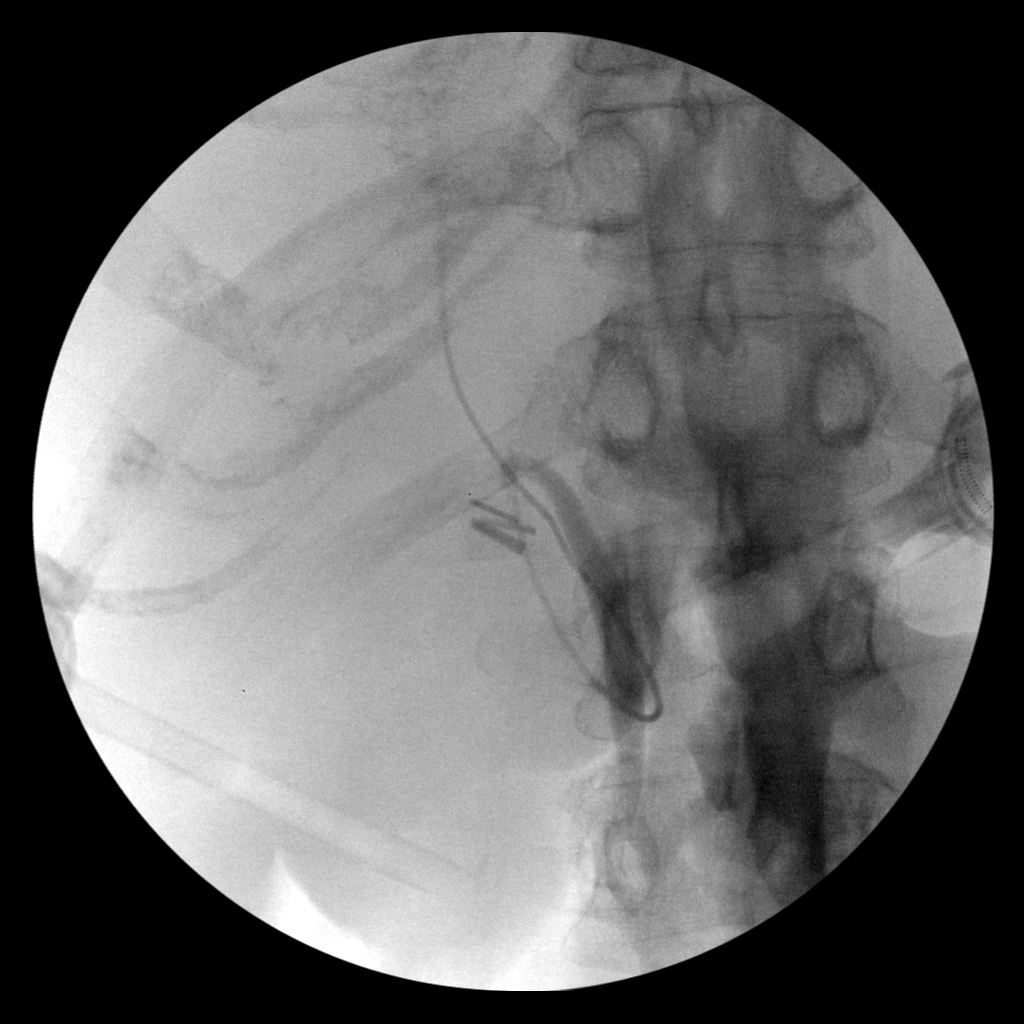
[frame 61/121]
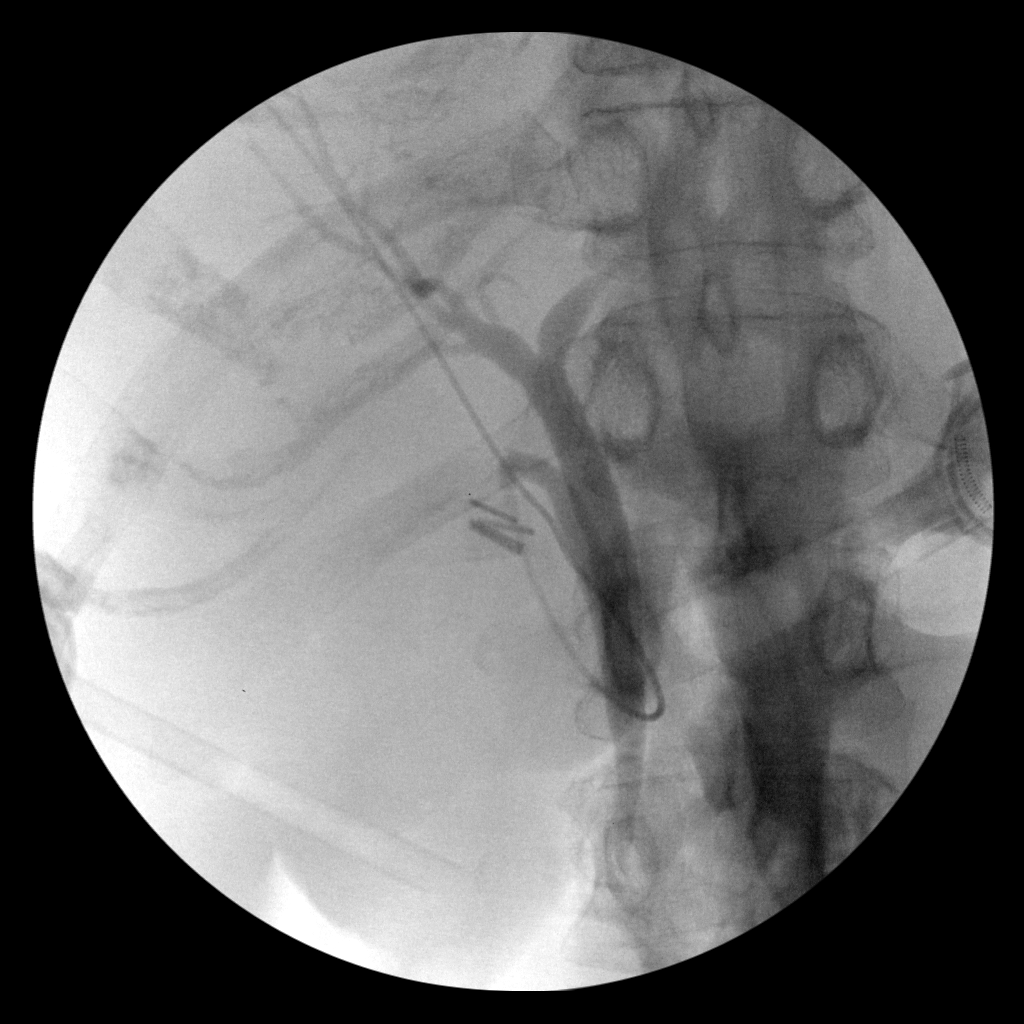
[frame 103/121]
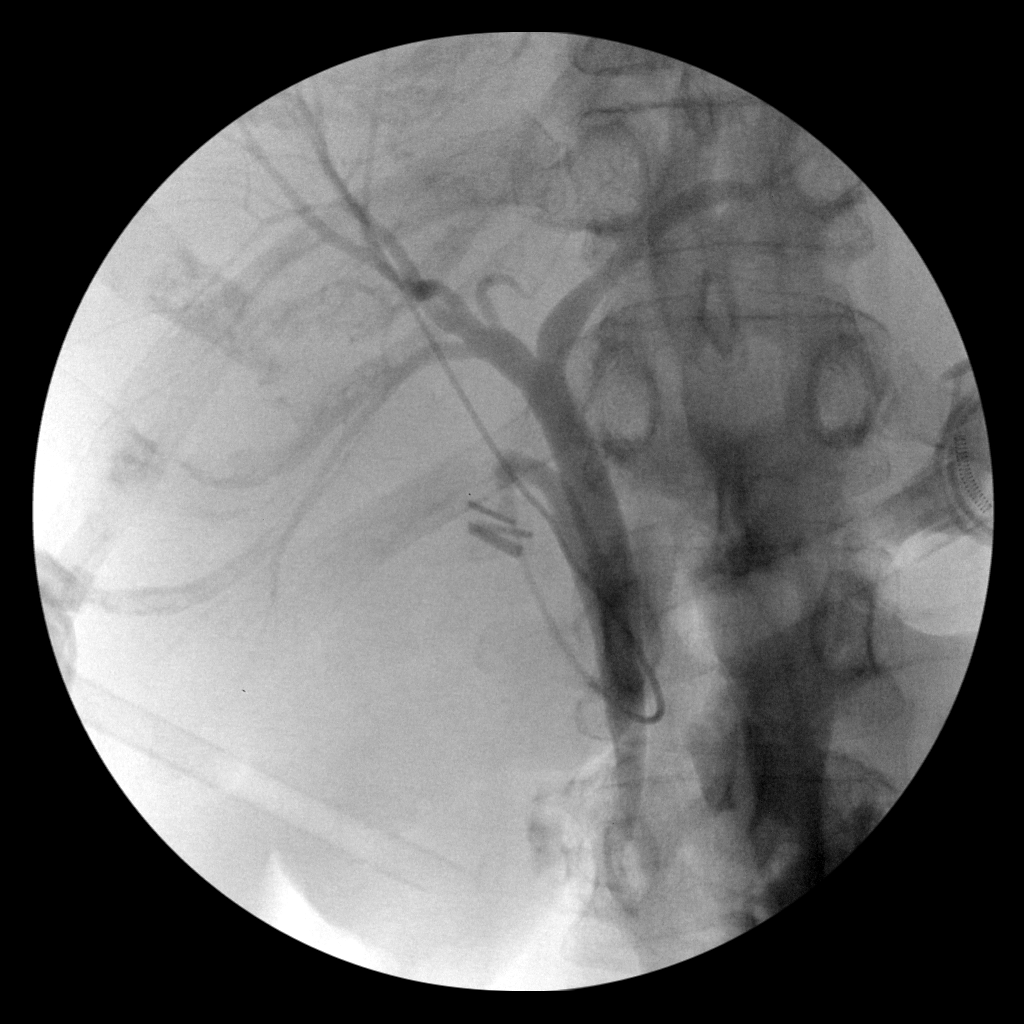

[4 of 4 positions shown; findings below may reference images not displayed]

FINDINGS: Intraoperative cholangiogram performed during the laparoscopic
cholecystectomy. The cystic duct, biliary confluence, common hepatic
duct, and common bile duct appear patent. Contrast drains easily
into the duodenum. No dilatation, obstruction, stricture, or
significant filling defect.
IMPRESSION: Patent biliary system.

## 2018-02-11 DIAGNOSIS — I10 Essential (primary) hypertension: Secondary | ICD-10-CM | POA: Diagnosis not present

## 2018-02-11 DIAGNOSIS — N183 Chronic kidney disease, stage 3 (moderate): Secondary | ICD-10-CM | POA: Diagnosis not present

## 2018-02-11 DIAGNOSIS — E782 Mixed hyperlipidemia: Secondary | ICD-10-CM | POA: Diagnosis not present

## 2018-02-11 DIAGNOSIS — D509 Iron deficiency anemia, unspecified: Secondary | ICD-10-CM | POA: Diagnosis not present

## 2018-02-11 DIAGNOSIS — E559 Vitamin D deficiency, unspecified: Secondary | ICD-10-CM | POA: Diagnosis not present

## 2018-05-02 DIAGNOSIS — L814 Other melanin hyperpigmentation: Secondary | ICD-10-CM | POA: Diagnosis not present

## 2018-05-02 DIAGNOSIS — D1801 Hemangioma of skin and subcutaneous tissue: Secondary | ICD-10-CM | POA: Diagnosis not present

## 2018-05-02 DIAGNOSIS — L821 Other seborrheic keratosis: Secondary | ICD-10-CM | POA: Diagnosis not present

## 2018-05-02 DIAGNOSIS — D225 Melanocytic nevi of trunk: Secondary | ICD-10-CM | POA: Diagnosis not present

## 2018-05-02 DIAGNOSIS — L820 Inflamed seborrheic keratosis: Secondary | ICD-10-CM | POA: Diagnosis not present

## 2018-08-25 DIAGNOSIS — H527 Unspecified disorder of refraction: Secondary | ICD-10-CM | POA: Diagnosis not present

## 2018-12-29 ENCOUNTER — Other Ambulatory Visit: Payer: Self-pay | Admitting: Family Medicine

## 2018-12-29 DIAGNOSIS — D509 Iron deficiency anemia, unspecified: Secondary | ICD-10-CM | POA: Diagnosis not present

## 2018-12-29 DIAGNOSIS — E559 Vitamin D deficiency, unspecified: Secondary | ICD-10-CM | POA: Diagnosis not present

## 2018-12-29 DIAGNOSIS — Z1231 Encounter for screening mammogram for malignant neoplasm of breast: Secondary | ICD-10-CM

## 2018-12-29 DIAGNOSIS — I10 Essential (primary) hypertension: Secondary | ICD-10-CM | POA: Diagnosis not present

## 2018-12-29 DIAGNOSIS — E782 Mixed hyperlipidemia: Secondary | ICD-10-CM | POA: Diagnosis not present

## 2019-01-23 DIAGNOSIS — E782 Mixed hyperlipidemia: Secondary | ICD-10-CM | POA: Diagnosis not present

## 2019-01-23 DIAGNOSIS — E559 Vitamin D deficiency, unspecified: Secondary | ICD-10-CM | POA: Diagnosis not present

## 2019-01-23 DIAGNOSIS — R202 Paresthesia of skin: Secondary | ICD-10-CM | POA: Diagnosis not present

## 2019-01-23 DIAGNOSIS — D509 Iron deficiency anemia, unspecified: Secondary | ICD-10-CM | POA: Diagnosis not present

## 2019-01-23 DIAGNOSIS — I10 Essential (primary) hypertension: Secondary | ICD-10-CM | POA: Diagnosis not present

## 2019-04-28 ENCOUNTER — Ambulatory Visit
Admission: RE | Admit: 2019-04-28 | Discharge: 2019-04-28 | Disposition: A | Discharge status: Home / Self Care | Payer: BLUE CROSS/BLUE SHIELD | Source: Ambulatory Visit | Attending: Family Medicine | Admitting: Family Medicine

## 2019-04-28 ENCOUNTER — Other Ambulatory Visit: Payer: Self-pay

## 2019-04-28 DIAGNOSIS — Z1231 Encounter for screening mammogram for malignant neoplasm of breast: Secondary | ICD-10-CM

## 2019-05-05 DIAGNOSIS — L814 Other melanin hyperpigmentation: Secondary | ICD-10-CM | POA: Diagnosis not present

## 2019-05-05 DIAGNOSIS — D485 Neoplasm of uncertain behavior of skin: Secondary | ICD-10-CM | POA: Diagnosis not present

## 2019-05-05 DIAGNOSIS — L579 Skin changes due to chronic exposure to nonionizing radiation, unspecified: Secondary | ICD-10-CM | POA: Diagnosis not present

## 2019-05-05 DIAGNOSIS — L821 Other seborrheic keratosis: Secondary | ICD-10-CM | POA: Diagnosis not present

## 2019-05-05 DIAGNOSIS — D1801 Hemangioma of skin and subcutaneous tissue: Secondary | ICD-10-CM | POA: Diagnosis not present

## 2019-05-05 DIAGNOSIS — D225 Melanocytic nevi of trunk: Secondary | ICD-10-CM | POA: Diagnosis not present

## 2019-07-29 DIAGNOSIS — E559 Vitamin D deficiency, unspecified: Secondary | ICD-10-CM | POA: Diagnosis not present

## 2019-07-29 DIAGNOSIS — Z1389 Encounter for screening for other disorder: Secondary | ICD-10-CM | POA: Diagnosis not present

## 2019-07-29 DIAGNOSIS — D509 Iron deficiency anemia, unspecified: Secondary | ICD-10-CM | POA: Diagnosis not present

## 2019-07-29 DIAGNOSIS — Z0000 Encounter for general adult medical examination without abnormal findings: Secondary | ICD-10-CM | POA: Diagnosis not present

## 2019-07-29 DIAGNOSIS — E782 Mixed hyperlipidemia: Secondary | ICD-10-CM | POA: Diagnosis not present

## 2019-07-29 DIAGNOSIS — I10 Essential (primary) hypertension: Secondary | ICD-10-CM | POA: Diagnosis not present

## 2020-01-07 DIAGNOSIS — H524 Presbyopia: Secondary | ICD-10-CM | POA: Diagnosis not present

## 2020-02-03 DIAGNOSIS — D509 Iron deficiency anemia, unspecified: Secondary | ICD-10-CM | POA: Diagnosis not present

## 2020-02-03 DIAGNOSIS — E559 Vitamin D deficiency, unspecified: Secondary | ICD-10-CM | POA: Diagnosis not present

## 2020-02-03 DIAGNOSIS — E782 Mixed hyperlipidemia: Secondary | ICD-10-CM | POA: Diagnosis not present

## 2020-02-03 DIAGNOSIS — I10 Essential (primary) hypertension: Secondary | ICD-10-CM | POA: Diagnosis not present

## 2020-05-10 DIAGNOSIS — L814 Other melanin hyperpigmentation: Secondary | ICD-10-CM | POA: Diagnosis not present

## 2020-05-10 DIAGNOSIS — D225 Melanocytic nevi of trunk: Secondary | ICD-10-CM | POA: Diagnosis not present

## 2020-05-10 DIAGNOSIS — D1801 Hemangioma of skin and subcutaneous tissue: Secondary | ICD-10-CM | POA: Diagnosis not present

## 2020-05-10 DIAGNOSIS — L820 Inflamed seborrheic keratosis: Secondary | ICD-10-CM | POA: Diagnosis not present

## 2020-08-17 DIAGNOSIS — Z1211 Encounter for screening for malignant neoplasm of colon: Secondary | ICD-10-CM | POA: Diagnosis not present

## 2020-08-17 DIAGNOSIS — I129 Hypertensive chronic kidney disease with stage 1 through stage 4 chronic kidney disease, or unspecified chronic kidney disease: Secondary | ICD-10-CM | POA: Diagnosis not present

## 2020-08-17 DIAGNOSIS — Z0000 Encounter for general adult medical examination without abnormal findings: Secondary | ICD-10-CM | POA: Diagnosis not present

## 2020-08-17 DIAGNOSIS — D509 Iron deficiency anemia, unspecified: Secondary | ICD-10-CM | POA: Diagnosis not present

## 2020-08-17 DIAGNOSIS — E559 Vitamin D deficiency, unspecified: Secondary | ICD-10-CM | POA: Diagnosis not present

## 2020-08-17 DIAGNOSIS — Z1389 Encounter for screening for other disorder: Secondary | ICD-10-CM | POA: Diagnosis not present

## 2020-08-17 DIAGNOSIS — E782 Mixed hyperlipidemia: Secondary | ICD-10-CM | POA: Diagnosis not present

## 2020-08-24 DIAGNOSIS — E785 Hyperlipidemia, unspecified: Secondary | ICD-10-CM | POA: Diagnosis not present

## 2020-08-24 DIAGNOSIS — N951 Menopausal and female climacteric states: Secondary | ICD-10-CM | POA: Diagnosis not present

## 2020-08-24 DIAGNOSIS — E538 Deficiency of other specified B group vitamins: Secondary | ICD-10-CM | POA: Diagnosis not present

## 2020-08-24 DIAGNOSIS — Z1329 Encounter for screening for other suspected endocrine disorder: Secondary | ICD-10-CM | POA: Diagnosis not present

## 2020-08-24 DIAGNOSIS — K219 Gastro-esophageal reflux disease without esophagitis: Secondary | ICD-10-CM | POA: Diagnosis not present

## 2020-08-24 DIAGNOSIS — Z131 Encounter for screening for diabetes mellitus: Secondary | ICD-10-CM | POA: Diagnosis not present

## 2020-08-24 DIAGNOSIS — R5383 Other fatigue: Secondary | ICD-10-CM | POA: Diagnosis not present

## 2020-10-10 DIAGNOSIS — R5383 Other fatigue: Secondary | ICD-10-CM | POA: Diagnosis not present

## 2020-10-10 DIAGNOSIS — Z1329 Encounter for screening for other suspected endocrine disorder: Secondary | ICD-10-CM | POA: Diagnosis not present

## 2020-10-10 DIAGNOSIS — K219 Gastro-esophageal reflux disease without esophagitis: Secondary | ICD-10-CM | POA: Diagnosis not present

## 2020-10-10 DIAGNOSIS — R208 Other disturbances of skin sensation: Secondary | ICD-10-CM | POA: Diagnosis not present

## 2021-01-25 ENCOUNTER — Other Ambulatory Visit: Payer: Self-pay | Admitting: Family Medicine

## 2021-01-25 DIAGNOSIS — Z1231 Encounter for screening mammogram for malignant neoplasm of breast: Secondary | ICD-10-CM

## 2021-02-08 ENCOUNTER — Other Ambulatory Visit: Payer: Self-pay | Admitting: Family Medicine

## 2021-02-08 DIAGNOSIS — N649 Disorder of breast, unspecified: Secondary | ICD-10-CM

## 2021-03-09 DIAGNOSIS — R071 Chest pain on breathing: Secondary | ICD-10-CM | POA: Diagnosis not present

## 2021-03-09 DIAGNOSIS — E860 Dehydration: Secondary | ICD-10-CM | POA: Diagnosis not present

## 2021-03-09 DIAGNOSIS — R9431 Abnormal electrocardiogram [ECG] [EKG]: Secondary | ICD-10-CM | POA: Diagnosis not present

## 2021-03-09 DIAGNOSIS — R197 Diarrhea, unspecified: Secondary | ICD-10-CM | POA: Diagnosis not present

## 2021-03-10 ENCOUNTER — Other Ambulatory Visit: Payer: Self-pay

## 2021-03-10 ENCOUNTER — Ambulatory Visit: Payer: BLUE CROSS/BLUE SHIELD | Admitting: Cardiology

## 2021-03-10 ENCOUNTER — Encounter: Payer: Self-pay | Admitting: Cardiology

## 2021-03-10 VITALS — BP 105/71 | HR 96 | Temp 97.2°F | Resp 17 | Ht 64.0 in | Wt 177.4 lb

## 2021-03-10 DIAGNOSIS — I10 Essential (primary) hypertension: Secondary | ICD-10-CM | POA: Diagnosis not present

## 2021-03-10 DIAGNOSIS — R9431 Abnormal electrocardiogram [ECG] [EKG]: Secondary | ICD-10-CM | POA: Diagnosis not present

## 2021-03-10 DIAGNOSIS — R072 Precordial pain: Secondary | ICD-10-CM | POA: Insufficient documentation

## 2021-03-10 DIAGNOSIS — E782 Mixed hyperlipidemia: Secondary | ICD-10-CM

## 2021-03-10 DIAGNOSIS — Z8249 Family history of ischemic heart disease and other diseases of the circulatory system: Secondary | ICD-10-CM

## 2021-03-10 MED ORDER — ASPIRIN EC 81 MG PO TBEC: 81.0000 mg | DELAYED_RELEASE_TABLET | Freq: Every day | ORAL | 1 refills | Status: AC

## 2021-03-10 MED ORDER — METOPROLOL TARTRATE 50 MG PO TABS: 50.0000 mg | ORAL_TABLET | Freq: Two times a day (BID) | ORAL | 1 refills | Status: AC

## 2021-03-10 MED ORDER — NITROGLYCERIN 0.4 MG SL SUBL
0.4000 mg | SUBLINGUAL_TABLET | SUBLINGUAL | 1 refills | Status: DC | PRN
Start: 2021-03-10 — End: 2021-04-14

## 2021-03-10 NOTE — Patient Instructions (Addendum)
Your cardiac CT will be scheduled at the locations below:   [facility]  [address]  [place], [place] [address]  [phone]    If scheduled at [facility], please arrive at the [facility] main entrance of [facility] 30-45 minutes prior to test start time.  Proceed to the [facility] Radiology Department (first floor) to check-in and test prep.   Please follow these instructions carefully (unless otherwise directed):   On the Night Before the Test:   Be sure to Drink plenty of water.   Do not consume any caffeinated/decaffeinated beverages or chocolate 12 hours prior to your test.   Do not take any antihistamines 12 hours prior to your test.   If the patient has contrast allergy:  Patient will need a prescription for Prednisone and very clear instructions (as follows):  1. Prednisone 50 mg - take 13 hours prior to test  2. Take another Prednisone 50 mg 7 hours prior to test  3. Take another Prednisone 50 mg 1 hour prior to test  4. Take Benadryl 50 mg 1 hour prior to test   Patient must complete all four doses of above prophylactic medications.   Patient will need a ride after test due to Benadryl.   On the Day of the Test:   Drink plenty of water. Do not drink any water within one hour of the test.   Do not eat any food 4 hours prior to the test.   You may take your regular medications prior to the test.   - Metoprolol tartarate 50 mg twice daily starting two days before CT scan Take 100 mg 2 hours before CT scan You may stop it after the CT scan, unless specified otherwise by me.    FEMALES- please wear underwire-free bra if available          After the Test:   Drink plenty of water.   After receiving IV contrast, you may experience a mild flushed feeling. This is normal.   On occasion, you may experience a mild rash up to 24 hours after the test. This is not dangerous. If this occurs, you can take Benadryl 25 mg and  increase your fluid intake.   If you experience trouble breathing, this can be serious. If it is severe call 911 IMMEDIATELY. If it is mild, please call our office.   If you take any of these medications: Glipizide/Metformin, Avandament, Glucavance, please do not take 48 hours after completing test unless otherwise instructed.     Please contact the cardiac imaging nurse navigator should you have any questions/concerns  Marchia Bond, RN Navigator [facility] [place] [facility]  [phone] Office  [phone] Cell     -----------------------------------------------------------------------------------------------------------------------------------------------------------  Diet & Lifestyle recommendations:  (Please start physical exertion after severe blockages are ruled out) Physical activity recommendation (The Physical Activity Guidelines for Americans. JAMA 2018;Nov 12) At least 150-300 minutes a week of moderate-intensity, or 75-150 minutes a week of vigorous-intensity aerobic physical activity, or an equivalent combination of moderate- and vigorous-intensity aerobic activity. Adults should perform muscle-strengthening activities on 2 or more days a week. Older adults should do multicomponent physical activity that includes balance training as well as aerobic and muscle-strengthening activities. Benefits of increased physical activity include lower risk of mortality including cardiovascular mortality, lower risk of cardiovascular events and associated risk factors (hypertension and diabetes), and lower risk of many cancers (including bladder, breast, colon, endometrium, esophagus, kidney,  lung, and stomach). Additional improvments have been seen in cognition, risk of dementia, anxiety and depression, improved bone health, lower risk of falls, and associated injuries.  Dietary recommendation The 2019 ACC/AHA guidelines promote nutrition as a main fixture of  cardiovascular wellness, with a recommendation for a varied diet of fruit, vegetables, fish, legumes, and whole grains (Class I), as well as recommendations to reduce sodium, cholesterol, processed meats, and refined sugars (Class IIa recommendation).10 Sodium intake, a topic of some controversy as of late, is recommended to be kept at 1,500 mg/day or less, far below the average daily intake in the [place] of 3,409 mg/day, and notably below that of previous [place] recommendations for 300mg /day.10,11 For those unable to reach 1,500 mg/day, they recommend at least a reduction of 1000 mg/day.  A Pesco-Mediterranean Diet With Intermittent Fasting: JACC Review Topic of the Week. J Am Coll Cardiol 3790;24:0973-5329 Pesco-Mediterranean diet, it is supplemented with extra-virgin olive oil (EVOO), which is the principle fat source, along with moderate amounts of dairy (particularly yogurt and cheese) and eggs, as well as modest amounts of alcohol consumption (ideally red wine with the evening meal), but few red and processed meats.

## 2021-03-10 NOTE — Progress Notes (Signed)
Patient referred by Maury Dus, MD for chest tightness, abnormal EKG  Subjective:   Helen Hughes, female    DOB: 07-19-1955, 65 y.o.   MRN: [id number]   Chief Complaint  Patient presents with   New Patient (Initial Visit)   Abnormal ECG     HPI  65 y.o. Caucasian female with hypertension, hyperlipidemia, family history of early CAD, referred for evaluation of chest tightness, abnormal EKG  Reports episodes of retrosternal chest tightness that seem to occur with emotional and mental stress.  Last episode happened few days ago while she was driving to work, radiated to her back.  It lasted for few minutes and resolved on its own.  Her physical activity is limited to walking about a mile and a stop and go fashion with her dog.  With this level of activity, she does not have any significant chest pain or shortness of breath symptoms.  On a separate note, patient had 48 hours of GI illness with diarrhea that is now completely resolved.  She has been dehydrated secondary to the same.  Patient works at a [employer].  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  This is led to increased work stress she is retiring at the end of this month.  She is looking forward to making changes to her diet and lifestyle, similar to what she did about a year ago that reportedly led to reduction in her cholesterol levels.  She is averse to idea of starting any pharmacological treatment at this time.  Her fear stems from her mother's history of side effects to statins.  She described it as severe weakness and "increase enzyme levels", and had to undergo plasmapheresis.  Overall, history is suggestive of possible statin induced rhabdomyolysis in her mother.  Patient is strong family history of coronary artery disease with 1 sister having had CABG before age 85, another sister undergoing CABG at age 7.  Her mother had brain  aneurysm.   Past Medical History:  Diagnosis Date   Hyperlipidemia    Hypertension      Past Surgical History:  Procedure Laterality Date   ABDOMINAL HYSTERECTOMY     CHOLECYSTECTOMY N/A 06/06/2016   Procedure: LAPAROSCOPIC CHOLECYSTECTOMY WITH INTRAOPERATIVE CHOLANGIOGRAM;  Surgeon: Armandina Gemma, MD;  Location: [facility];  Service: General;  Laterality: N/A;     Social History   Tobacco Use  Smoking Status Former   Types: Cigarettes   Quit date: 06/05/1977   Years since quitting: 43.7  Smokeless Tobacco Never    Social History   Substance and Sexual Activity  Alcohol Use Yes   Comment: occasional wine     Family History  Problem Relation Age of Onset   Cerebral aneurysm Mother 70   Heart disease Sister    Brain cancer Sister 49   Heart disease Sister    Stroke Maternal Grandmother    Heart attack Maternal Grandfather      Current Outpatient Medications on File Prior to Visit  Medication Sig Dispense Refill   acetaminophen (TYLENOL) 325 MG tablet Take 650 mg by mouth every 6 (six) hours as needed.     aspirin 325 MG tablet Take 1 tablet by mouth as needed.     b complex vitamins capsule Take 1 capsule by mouth daily.     cholecalciferol (VITAMIN D) 1000 units tablet Take 1,000 Units by mouth daily.  losartan-hydrochlorothiazide (HYZAAR) 100-25 MG tablet Take 1 tablet by mouth every morning.  1   magnesium 30 MG tablet Take 30 mg by mouth daily.     No current facility-administered medications on file prior to visit.    Cardiovascular and other pertinent studies:  EKG 03/10/2021: Sinus rhythm 84 bpm  Nonspecific ST depression     Recent labs: 03/09/2021: Glucose 112, BUN/Cr 25/1.54. EGFR 37. Na/K 136/3.5. Rest of the CMP normal H/H 12/37. MCV 87. Platelets 393 HbA1C N/A Chol 295, TG 414, HDL 49, calculated LDL 166 TSH 1.8 normal    Review of Systems  Cardiovascular:  Positive for chest pain. Negative for dyspnea on exertion, leg swelling,  palpitations and syncope.        Vitals:   03/10/21 1228  BP: 105/71  Pulse: 96  Resp: 17  Temp: (!) 97.2 F (36.2 C)  SpO2: 98%     Body mass index is 30.45 kg/m. Filed Weights   03/10/21 1228  Weight: 177 lb 6.4 oz (80.5 kg)     Objective:   Physical Exam Vitals and nursing note reviewed.  Constitutional:      General: She is not in acute distress. Neck:     Vascular: No JVD.  Cardiovascular:     Rate and Rhythm: Normal rate and regular rhythm.     Pulses: Normal pulses.     Heart sounds: Normal heart sounds. No murmur heard. Pulmonary:     Effort: Pulmonary effort is normal.     Breath sounds: Normal breath sounds. No wheezing or rales.  Musculoskeletal:     Right lower leg: No edema.     Left lower leg: No edema.  Neurological:     Comments: Burning sensation in feet, mostly at night.  Feels like "rocks inside socks"        Assessment & Recommendations:    65 y.o. Caucasian female with hypertension, hyperlipidemia, family history of early CAD, referred for evaluation of chest tightness, abnormal EKG  Chest tightness: Symptoms produced by emotional stress.  Strong risk factors with uncontrolled hyperlipidemia, family history of premature CAD. Recommend CT coronary angiogram with calcium score for anatomical and physiological evaluation.  For now, recommend aspirin 81 mg daily, sublingual nitroglycerin as needed. Metoprolol tartrate 50 mg twice daily to be used for CT scan. Continue current antihypertensive therapy. See below regarding lipid management.  Mixed hyperlipidemia: LDL 166.  10-year ASCVD risk score marginal at 7.1%.  However, patient has strong family history of premature CAD. Recommend calcium score, along with CT scan as above for risk stratification. In the meantime, we discussed diet and lifestyle modifications at length, including raising processed and red meat intake, increasing fish and whole grains, fruits and vegetables, nuts.  She  is looking forward to starting to cook at home again, as she retired from her current job. Patient is averse to statin therapy due to family history of probable statin induced rhabdomyolysis. Will discuss nonstatin therapy options again after above testing.   Thank you for referring the patient to [place]. Please feel free to contact with any questions.   Nigel Mormon, MD Pager: [phone] Office: [phone]

## 2021-04-14 ENCOUNTER — Other Ambulatory Visit: Payer: Self-pay | Admitting: Cardiology

## 2021-04-14 DIAGNOSIS — R072 Precordial pain: Secondary | ICD-10-CM

## 2021-04-21 ENCOUNTER — Ambulatory Visit
Admission: RE | Admit: 2021-04-21 | Discharge: 2021-04-21 | Disposition: A | Discharge status: Home / Self Care | Payer: 59 | Source: Ambulatory Visit | Attending: Family Medicine | Admitting: Family Medicine

## 2021-04-21 ENCOUNTER — Ambulatory Visit
Admission: RE | Admit: 2021-04-21 | Discharge: 2021-04-21 | Disposition: A | Discharge status: Home / Self Care | Payer: BLUE CROSS/BLUE SHIELD | Source: Ambulatory Visit | Attending: Family Medicine | Admitting: Family Medicine

## 2021-04-21 DIAGNOSIS — N649 Disorder of breast, unspecified: Secondary | ICD-10-CM

## 2021-06-21 DIAGNOSIS — N1831 Chronic kidney disease, stage 3a: Secondary | ICD-10-CM | POA: Diagnosis not present

## 2021-06-22 DIAGNOSIS — K219 Gastro-esophageal reflux disease without esophagitis: Secondary | ICD-10-CM | POA: Diagnosis not present

## 2021-06-22 DIAGNOSIS — R197 Diarrhea, unspecified: Secondary | ICD-10-CM | POA: Diagnosis not present

## 2021-06-27 DIAGNOSIS — N1831 Chronic kidney disease, stage 3a: Secondary | ICD-10-CM | POA: Diagnosis not present

## 2021-06-27 DIAGNOSIS — M25511 Pain in right shoulder: Secondary | ICD-10-CM | POA: Diagnosis not present

## 2021-06-29 DIAGNOSIS — M7581 Other shoulder lesions, right shoulder: Secondary | ICD-10-CM | POA: Diagnosis not present

## 2021-06-29 DIAGNOSIS — M5030 Other cervical disc degeneration, unspecified cervical region: Secondary | ICD-10-CM | POA: Diagnosis not present

## 2021-09-18 DIAGNOSIS — R7301 Impaired fasting glucose: Secondary | ICD-10-CM | POA: Diagnosis not present

## 2021-09-18 DIAGNOSIS — I129 Hypertensive chronic kidney disease with stage 1 through stage 4 chronic kidney disease, or unspecified chronic kidney disease: Secondary | ICD-10-CM | POA: Diagnosis not present

## 2021-09-18 DIAGNOSIS — N1831 Chronic kidney disease, stage 3a: Secondary | ICD-10-CM | POA: Diagnosis not present

## 2021-09-18 DIAGNOSIS — R202 Paresthesia of skin: Secondary | ICD-10-CM | POA: Diagnosis not present

## 2021-09-18 DIAGNOSIS — D509 Iron deficiency anemia, unspecified: Secondary | ICD-10-CM | POA: Diagnosis not present

## 2021-09-18 DIAGNOSIS — E559 Vitamin D deficiency, unspecified: Secondary | ICD-10-CM | POA: Diagnosis not present

## 2021-09-18 DIAGNOSIS — Z1211 Encounter for screening for malignant neoplasm of colon: Secondary | ICD-10-CM | POA: Diagnosis not present

## 2021-09-18 DIAGNOSIS — Z6830 Body mass index (BMI) 30.0-30.9, adult: Secondary | ICD-10-CM | POA: Diagnosis not present

## 2021-09-18 DIAGNOSIS — Z0000 Encounter for general adult medical examination without abnormal findings: Secondary | ICD-10-CM | POA: Diagnosis not present

## 2021-09-18 DIAGNOSIS — E782 Mixed hyperlipidemia: Secondary | ICD-10-CM | POA: Diagnosis not present

## 2021-10-04 DIAGNOSIS — K644 Residual hemorrhoidal skin tags: Secondary | ICD-10-CM | POA: Diagnosis not present

## 2021-10-04 DIAGNOSIS — R197 Diarrhea, unspecified: Secondary | ICD-10-CM | POA: Diagnosis not present

## 2021-10-04 DIAGNOSIS — D122 Benign neoplasm of ascending colon: Secondary | ICD-10-CM | POA: Diagnosis not present

## 2021-10-04 DIAGNOSIS — Z1211 Encounter for screening for malignant neoplasm of colon: Secondary | ICD-10-CM | POA: Diagnosis not present

## 2021-10-04 DIAGNOSIS — K648 Other hemorrhoids: Secondary | ICD-10-CM | POA: Diagnosis not present

## 2021-10-06 DIAGNOSIS — D122 Benign neoplasm of ascending colon: Secondary | ICD-10-CM | POA: Diagnosis not present

## 2021-10-30 DIAGNOSIS — I10 Essential (primary) hypertension: Secondary | ICD-10-CM | POA: Diagnosis not present

## 2022-02-27 ENCOUNTER — Other Ambulatory Visit: Payer: Self-pay | Admitting: Family Medicine

## 2022-02-27 DIAGNOSIS — I10 Essential (primary) hypertension: Secondary | ICD-10-CM

## 2022-02-27 DIAGNOSIS — E782 Mixed hyperlipidemia: Secondary | ICD-10-CM | POA: Diagnosis not present

## 2022-02-27 DIAGNOSIS — R7309 Other abnormal glucose: Secondary | ICD-10-CM | POA: Diagnosis not present

## 2022-02-27 DIAGNOSIS — N1831 Chronic kidney disease, stage 3a: Secondary | ICD-10-CM | POA: Diagnosis not present

## 2022-02-27 DIAGNOSIS — Z8249 Family history of ischemic heart disease and other diseases of the circulatory system: Secondary | ICD-10-CM

## 2022-04-06 ENCOUNTER — Ambulatory Visit
Admission: RE | Admit: 2022-04-06 | Discharge: 2022-04-06 | Disposition: A | Discharge status: Home / Self Care | Payer: No Typology Code available for payment source | Source: Ambulatory Visit | Attending: Family Medicine | Admitting: Family Medicine

## 2022-04-06 DIAGNOSIS — E782 Mixed hyperlipidemia: Secondary | ICD-10-CM

## 2022-04-06 DIAGNOSIS — Z8249 Family history of ischemic heart disease and other diseases of the circulatory system: Secondary | ICD-10-CM

## 2022-04-06 DIAGNOSIS — I700 Atherosclerosis of aorta: Secondary | ICD-10-CM | POA: Diagnosis not present

## 2022-04-06 DIAGNOSIS — I10 Essential (primary) hypertension: Secondary | ICD-10-CM

## 2022-10-16 DIAGNOSIS — E782 Mixed hyperlipidemia: Secondary | ICD-10-CM | POA: Diagnosis not present

## 2022-10-16 DIAGNOSIS — Z1231 Encounter for screening mammogram for malignant neoplasm of breast: Secondary | ICD-10-CM | POA: Diagnosis not present

## 2022-10-16 DIAGNOSIS — Z0000 Encounter for general adult medical examination without abnormal findings: Secondary | ICD-10-CM | POA: Diagnosis not present

## 2022-10-16 DIAGNOSIS — Z780 Asymptomatic menopausal state: Secondary | ICD-10-CM | POA: Diagnosis not present

## 2022-10-16 DIAGNOSIS — N1831 Chronic kidney disease, stage 3a: Secondary | ICD-10-CM | POA: Diagnosis not present

## 2022-10-16 DIAGNOSIS — I700 Atherosclerosis of aorta: Secondary | ICD-10-CM | POA: Diagnosis not present

## 2022-10-16 DIAGNOSIS — I10 Essential (primary) hypertension: Secondary | ICD-10-CM | POA: Diagnosis not present

## 2022-10-16 DIAGNOSIS — R7303 Prediabetes: Secondary | ICD-10-CM | POA: Diagnosis not present

## 2022-10-17 ENCOUNTER — Other Ambulatory Visit: Payer: Self-pay | Admitting: Family Medicine

## 2022-10-17 DIAGNOSIS — Z1231 Encounter for screening mammogram for malignant neoplasm of breast: Secondary | ICD-10-CM

## 2022-11-22 IMAGING — US US BREAST*L* LIMITED INC AXILLA
1 series · 3 of 3 positions shown · non-contrast
Comparison: Previous exam(s).

CLINICAL DATA: 65-year-old female presenting for evaluation of a
palpable lump in the left breast.

EXAM:
DIGITAL DIAGNOSTIC BILATERAL MAMMOGRAM WITH TOMOSYNTHESIS AND CAD;
ULTRASOUND LEFT BREAST LIMITED
TECHNIQUE: Bilateral digital diagnostic mammography and breast tomosynthesis
was performed. The images were evaluated with computer-aided
detection.; Targeted ultrasound examination of the left breast was
performed.

[Series 1: us breast*left* limited inc axilla · 0.07mm/px · 3 of 3 slices shown]
[im 1/3]
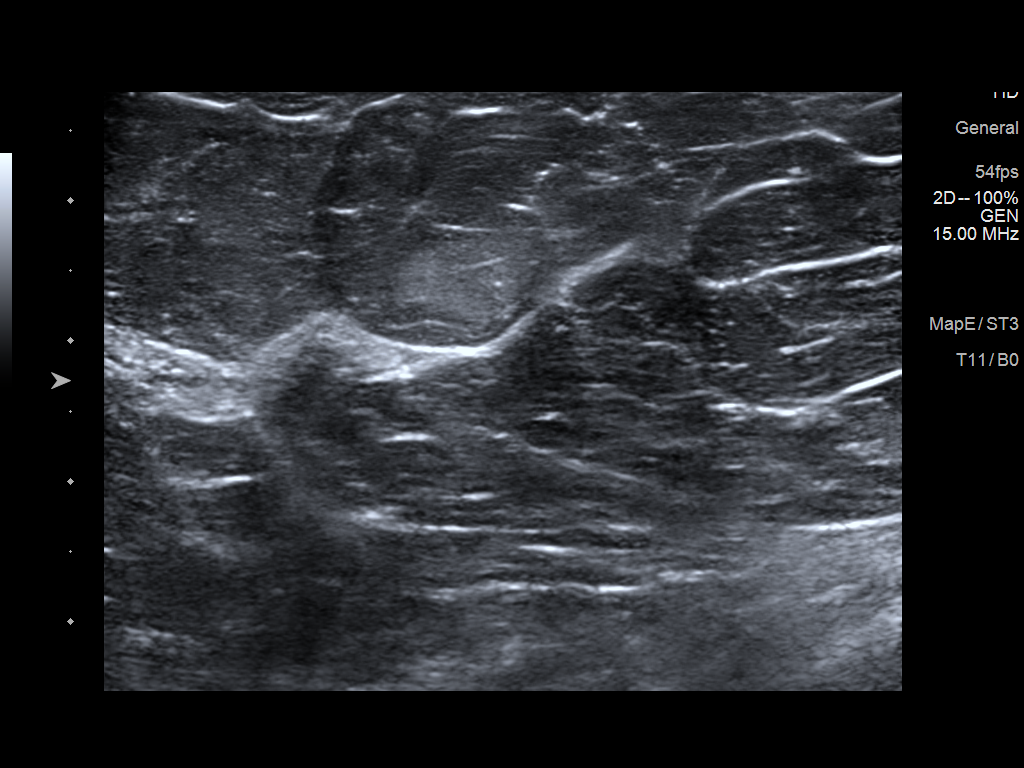
[im 2/3]
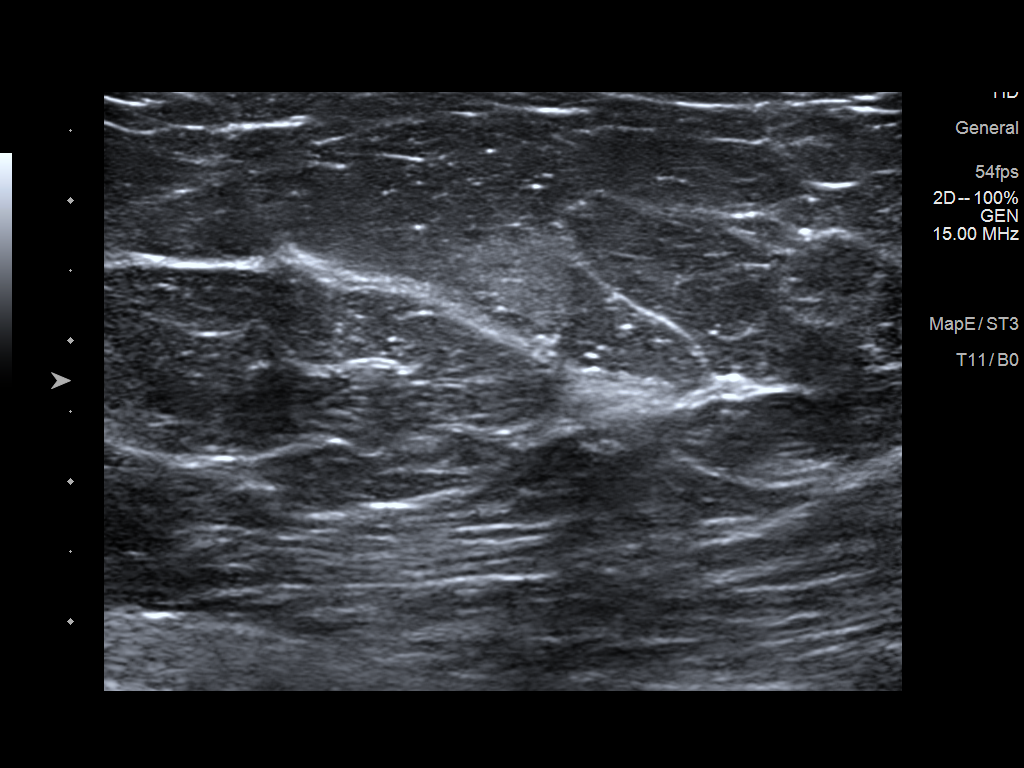
[im 3/3]
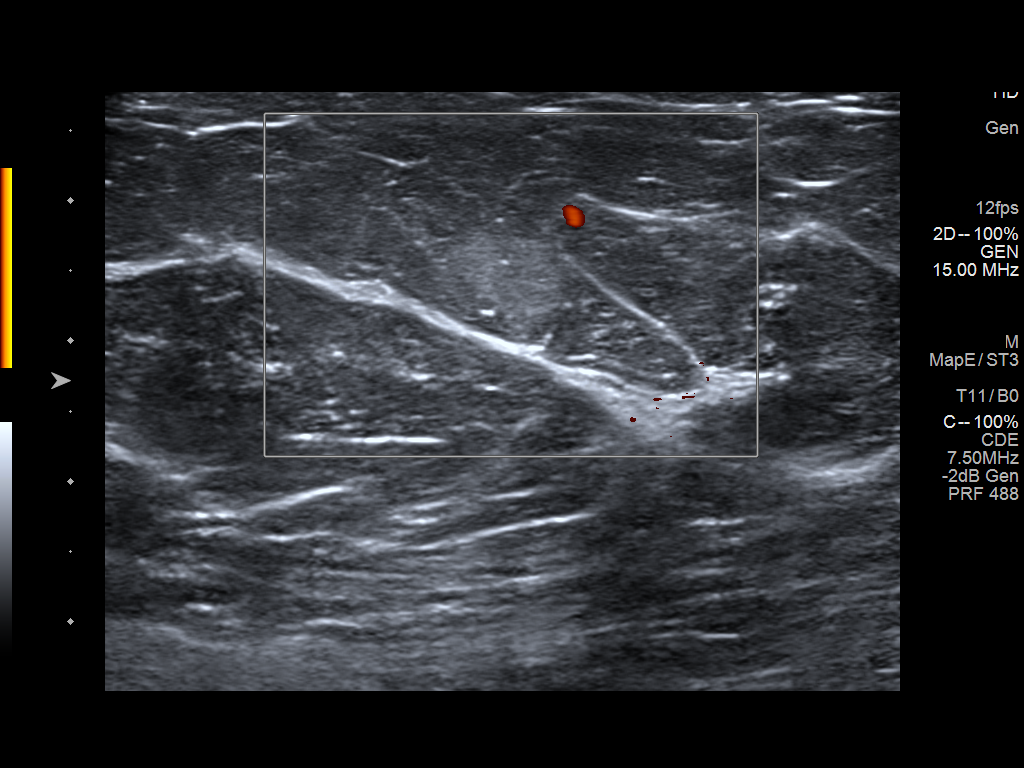

[3 of 3 positions shown; findings below may reference images not displayed]

ACR Breast Density Category b: There are scattered areas of
fibroglandular density.
FINDINGS: A BB has been placed along the lower slightly inner aspect of the
left breast indicating the palpable site of concern. There are no
suspicious mammographic findings deep to the marker. No suspicious
calcifications, masses or areas of distortion are seen in the
bilateral breasts.

Mammographic images were processed with CAD.

Physical exam of the palpable site in the lower-inner left breast
demonstrates no discrete palpable masses.

Ultrasound targeted to the left breast at 630, 4 cm from the nipple
demonstrates normal fibroglandular tissue. No masses or suspicious
areas of shadowing are identified.
IMPRESSION: 1. There are no suspicious mammographic or targeted sonographic
abnormalities at the palpable site of concern in the left breast.

2.  No mammographic evidence of malignancy in the bilateral breasts.

RECOMMENDATION:
1. Clinical follow-up recommended for the palpable area of concern
in the left breast. Any further workup should be based on clinical
grounds.

2.  Screening mammogram in one year.(Code:LE-N-QXA)

I have discussed the findings and recommendations with the patient.
If applicable, a reminder letter will be sent to the patient
regarding the next appointment.

BI-RADS CATEGORY  1: Negative.

## 2022-12-05 DIAGNOSIS — M5413 Radiculopathy, cervicothoracic region: Secondary | ICD-10-CM | POA: Diagnosis not present

## 2022-12-05 DIAGNOSIS — M5412 Radiculopathy, cervical region: Secondary | ICD-10-CM | POA: Diagnosis not present

## 2022-12-05 DIAGNOSIS — M62830 Muscle spasm of back: Secondary | ICD-10-CM | POA: Diagnosis not present

## 2022-12-05 DIAGNOSIS — M5417 Radiculopathy, lumbosacral region: Secondary | ICD-10-CM | POA: Diagnosis not present

## 2022-12-05 DIAGNOSIS — M546 Pain in thoracic spine: Secondary | ICD-10-CM | POA: Diagnosis not present

## 2022-12-05 DIAGNOSIS — M5414 Radiculopathy, thoracic region: Secondary | ICD-10-CM | POA: Diagnosis not present

## 2022-12-05 DIAGNOSIS — M7912 Myalgia of auxiliary muscles, head and neck: Secondary | ICD-10-CM | POA: Diagnosis not present

## 2022-12-05 DIAGNOSIS — M5415 Radiculopathy, thoracolumbar region: Secondary | ICD-10-CM | POA: Diagnosis not present

## 2022-12-12 DIAGNOSIS — M7912 Myalgia of auxiliary muscles, head and neck: Secondary | ICD-10-CM | POA: Diagnosis not present

## 2022-12-12 DIAGNOSIS — M5413 Radiculopathy, cervicothoracic region: Secondary | ICD-10-CM | POA: Diagnosis not present

## 2022-12-12 DIAGNOSIS — M5414 Radiculopathy, thoracic region: Secondary | ICD-10-CM | POA: Diagnosis not present

## 2022-12-12 DIAGNOSIS — M62830 Muscle spasm of back: Secondary | ICD-10-CM | POA: Diagnosis not present

## 2022-12-12 DIAGNOSIS — M546 Pain in thoracic spine: Secondary | ICD-10-CM | POA: Diagnosis not present

## 2022-12-12 DIAGNOSIS — M5415 Radiculopathy, thoracolumbar region: Secondary | ICD-10-CM | POA: Diagnosis not present

## 2022-12-12 DIAGNOSIS — M5417 Radiculopathy, lumbosacral region: Secondary | ICD-10-CM | POA: Diagnosis not present

## 2022-12-12 DIAGNOSIS — M5412 Radiculopathy, cervical region: Secondary | ICD-10-CM | POA: Diagnosis not present

## 2023-01-10 ENCOUNTER — Encounter: Payer: Self-pay | Admitting: Family Medicine

## 2023-01-10 DIAGNOSIS — Z1231 Encounter for screening mammogram for malignant neoplasm of breast: Secondary | ICD-10-CM

## 2023-01-28 ENCOUNTER — Other Ambulatory Visit: Payer: Self-pay | Admitting: Family Medicine

## 2023-01-28 DIAGNOSIS — Z13820 Encounter for screening for osteoporosis: Secondary | ICD-10-CM

## 2023-01-29 ENCOUNTER — Ambulatory Visit
Admission: RE | Admit: 2023-01-29 | Discharge: 2023-01-29 | Disposition: A | Discharge status: Home / Self Care | Payer: Medicare HMO | Source: Ambulatory Visit | Attending: Family Medicine

## 2023-01-29 DIAGNOSIS — N958 Other specified menopausal and perimenopausal disorders: Secondary | ICD-10-CM | POA: Diagnosis not present

## 2023-01-29 DIAGNOSIS — Z13820 Encounter for screening for osteoporosis: Secondary | ICD-10-CM

## 2023-01-29 DIAGNOSIS — Z90722 Acquired absence of ovaries, bilateral: Secondary | ICD-10-CM | POA: Diagnosis not present

## 2023-01-29 DIAGNOSIS — E2839 Other primary ovarian failure: Secondary | ICD-10-CM | POA: Diagnosis not present

## 2023-02-05 ENCOUNTER — Ambulatory Visit
Admission: RE | Admit: 2023-02-05 | Discharge: 2023-02-05 | Disposition: A | Discharge status: Home / Self Care | Payer: Medicare HMO | Source: Ambulatory Visit | Attending: Family Medicine | Admitting: Family Medicine

## 2023-02-05 DIAGNOSIS — Z1231 Encounter for screening mammogram for malignant neoplasm of breast: Secondary | ICD-10-CM

## 2023-04-19 DIAGNOSIS — R7303 Prediabetes: Secondary | ICD-10-CM | POA: Diagnosis not present

## 2023-04-19 DIAGNOSIS — K219 Gastro-esophageal reflux disease without esophagitis: Secondary | ICD-10-CM | POA: Diagnosis not present

## 2023-04-19 DIAGNOSIS — K529 Noninfective gastroenteritis and colitis, unspecified: Secondary | ICD-10-CM | POA: Diagnosis not present

## 2023-04-19 DIAGNOSIS — R29898 Other symptoms and signs involving the musculoskeletal system: Secondary | ICD-10-CM | POA: Diagnosis not present

## 2023-04-19 DIAGNOSIS — N1831 Chronic kidney disease, stage 3a: Secondary | ICD-10-CM | POA: Diagnosis not present

## 2023-04-19 DIAGNOSIS — I10 Essential (primary) hypertension: Secondary | ICD-10-CM | POA: Diagnosis not present

## 2023-04-19 DIAGNOSIS — E782 Mixed hyperlipidemia: Secondary | ICD-10-CM | POA: Diagnosis not present

## 2023-04-19 DIAGNOSIS — M7918 Myalgia, other site: Secondary | ICD-10-CM | POA: Diagnosis not present

## 2023-05-23 DIAGNOSIS — N1831 Chronic kidney disease, stage 3a: Secondary | ICD-10-CM | POA: Diagnosis not present

## 2023-05-23 DIAGNOSIS — E782 Mixed hyperlipidemia: Secondary | ICD-10-CM | POA: Diagnosis not present

## 2023-05-23 DIAGNOSIS — I10 Essential (primary) hypertension: Secondary | ICD-10-CM | POA: Diagnosis not present

## 2023-05-23 DIAGNOSIS — Z713 Dietary counseling and surveillance: Secondary | ICD-10-CM | POA: Diagnosis not present

## 2023-05-27 DIAGNOSIS — R29898 Other symptoms and signs involving the musculoskeletal system: Secondary | ICD-10-CM | POA: Diagnosis not present

## 2023-05-27 DIAGNOSIS — M25552 Pain in left hip: Secondary | ICD-10-CM | POA: Diagnosis not present

## 2023-05-27 DIAGNOSIS — M25551 Pain in right hip: Secondary | ICD-10-CM | POA: Diagnosis not present

## 2023-05-29 DIAGNOSIS — Z860100 Personal history of colon polyps, unspecified: Secondary | ICD-10-CM | POA: Diagnosis not present

## 2023-05-29 DIAGNOSIS — K219 Gastro-esophageal reflux disease without esophagitis: Secondary | ICD-10-CM | POA: Diagnosis not present

## 2023-05-29 DIAGNOSIS — K909 Intestinal malabsorption, unspecified: Secondary | ICD-10-CM | POA: Diagnosis not present

## 2023-06-12 DIAGNOSIS — M7062 Trochanteric bursitis, left hip: Secondary | ICD-10-CM | POA: Diagnosis not present

## 2023-06-12 DIAGNOSIS — M7061 Trochanteric bursitis, right hip: Secondary | ICD-10-CM | POA: Diagnosis not present

## 2023-06-27 DIAGNOSIS — M7061 Trochanteric bursitis, right hip: Secondary | ICD-10-CM | POA: Diagnosis not present

## 2023-06-27 DIAGNOSIS — M7062 Trochanteric bursitis, left hip: Secondary | ICD-10-CM | POA: Diagnosis not present

## 2023-07-04 DIAGNOSIS — M7061 Trochanteric bursitis, right hip: Secondary | ICD-10-CM | POA: Diagnosis not present

## 2023-07-04 DIAGNOSIS — M7062 Trochanteric bursitis, left hip: Secondary | ICD-10-CM | POA: Diagnosis not present

## 2023-07-11 DIAGNOSIS — M7061 Trochanteric bursitis, right hip: Secondary | ICD-10-CM | POA: Diagnosis not present

## 2023-07-11 DIAGNOSIS — M7062 Trochanteric bursitis, left hip: Secondary | ICD-10-CM | POA: Diagnosis not present

## 2023-07-24 DIAGNOSIS — M7061 Trochanteric bursitis, right hip: Secondary | ICD-10-CM | POA: Diagnosis not present

## 2023-07-24 DIAGNOSIS — M7062 Trochanteric bursitis, left hip: Secondary | ICD-10-CM | POA: Diagnosis not present

## 2023-08-01 DIAGNOSIS — M7061 Trochanteric bursitis, right hip: Secondary | ICD-10-CM | POA: Diagnosis not present

## 2023-08-01 DIAGNOSIS — M7062 Trochanteric bursitis, left hip: Secondary | ICD-10-CM | POA: Diagnosis not present

## 2023-08-05 DIAGNOSIS — T887XXA Unspecified adverse effect of drug or medicament, initial encounter: Secondary | ICD-10-CM | POA: Diagnosis not present

## 2023-08-05 DIAGNOSIS — M25551 Pain in right hip: Secondary | ICD-10-CM | POA: Diagnosis not present

## 2023-08-05 DIAGNOSIS — M533 Sacrococcygeal disorders, not elsewhere classified: Secondary | ICD-10-CM | POA: Diagnosis not present

## 2023-08-05 DIAGNOSIS — M5481 Occipital neuralgia: Secondary | ICD-10-CM | POA: Diagnosis not present

## 2023-08-05 DIAGNOSIS — M25552 Pain in left hip: Secondary | ICD-10-CM | POA: Diagnosis not present

## 2023-08-09 DIAGNOSIS — M7062 Trochanteric bursitis, left hip: Secondary | ICD-10-CM | POA: Diagnosis not present

## 2023-08-09 DIAGNOSIS — M7061 Trochanteric bursitis, right hip: Secondary | ICD-10-CM | POA: Diagnosis not present

## 2023-08-14 DIAGNOSIS — M7061 Trochanteric bursitis, right hip: Secondary | ICD-10-CM | POA: Diagnosis not present

## 2023-08-14 DIAGNOSIS — M7062 Trochanteric bursitis, left hip: Secondary | ICD-10-CM | POA: Diagnosis not present

## 2023-08-15 DIAGNOSIS — R634 Abnormal weight loss: Secondary | ICD-10-CM | POA: Diagnosis not present

## 2023-08-15 DIAGNOSIS — R110 Nausea: Secondary | ICD-10-CM | POA: Diagnosis not present

## 2023-08-23 DIAGNOSIS — M7062 Trochanteric bursitis, left hip: Secondary | ICD-10-CM | POA: Diagnosis not present

## 2023-08-23 DIAGNOSIS — M7061 Trochanteric bursitis, right hip: Secondary | ICD-10-CM | POA: Diagnosis not present

## 2023-08-27 DIAGNOSIS — M7061 Trochanteric bursitis, right hip: Secondary | ICD-10-CM | POA: Diagnosis not present

## 2023-08-27 DIAGNOSIS — M7062 Trochanteric bursitis, left hip: Secondary | ICD-10-CM | POA: Diagnosis not present

## 2023-09-11 DIAGNOSIS — M7062 Trochanteric bursitis, left hip: Secondary | ICD-10-CM | POA: Diagnosis not present

## 2023-09-11 DIAGNOSIS — M7061 Trochanteric bursitis, right hip: Secondary | ICD-10-CM | POA: Diagnosis not present

## 2023-09-16 DIAGNOSIS — M545 Low back pain, unspecified: Secondary | ICD-10-CM | POA: Diagnosis not present

## 2023-09-16 DIAGNOSIS — M533 Sacrococcygeal disorders, not elsewhere classified: Secondary | ICD-10-CM | POA: Diagnosis not present

## 2023-09-17 DIAGNOSIS — M7062 Trochanteric bursitis, left hip: Secondary | ICD-10-CM | POA: Diagnosis not present

## 2023-11-08 DIAGNOSIS — E538 Deficiency of other specified B group vitamins: Secondary | ICD-10-CM | POA: Diagnosis not present

## 2023-11-12 DIAGNOSIS — G8929 Other chronic pain: Secondary | ICD-10-CM | POA: Diagnosis not present

## 2023-11-12 DIAGNOSIS — M545 Low back pain, unspecified: Secondary | ICD-10-CM | POA: Diagnosis not present

## 2023-11-18 DIAGNOSIS — M5459 Other low back pain: Secondary | ICD-10-CM | POA: Diagnosis not present

## 2023-11-21 DIAGNOSIS — E538 Deficiency of other specified B group vitamins: Secondary | ICD-10-CM | POA: Diagnosis not present

## 2023-11-28 DIAGNOSIS — E538 Deficiency of other specified B group vitamins: Secondary | ICD-10-CM | POA: Diagnosis not present
